# Patient Record
Sex: Female | Born: 1980 | Race: White | Hispanic: No | Marital: Single | Smoking: Former smoker
Health system: Southern US, Community
[De-identification: ages and names within clinical notes are randomized; demographics above are authoritative.]

## PROBLEM LIST (undated history)

## (undated) DIAGNOSIS — A749 Chlamydial infection, unspecified: Secondary | ICD-10-CM

## (undated) DIAGNOSIS — T7840XA Allergy, unspecified, initial encounter: Secondary | ICD-10-CM

## (undated) DIAGNOSIS — Z8619 Personal history of other infectious and parasitic diseases: Secondary | ICD-10-CM

## (undated) DIAGNOSIS — N200 Calculus of kidney: Secondary | ICD-10-CM

## (undated) HISTORY — DX: Allergy, unspecified, initial encounter: T78.40XA

## (undated) HISTORY — DX: Chlamydial infection, unspecified: A74.9

## (undated) HISTORY — PX: DEBRIDEMENT  FOOT: SUR387

## (undated) HISTORY — PX: TUBAL LIGATION: SHX77

## (undated) HISTORY — PX: NO PAST SURGERIES: SHX2092

## (undated) HISTORY — DX: Personal history of other infectious and parasitic diseases: Z86.19

---
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
## 2004-04-15 ENCOUNTER — Inpatient Hospital Stay (HOSPITAL_COMMUNITY): Admission: EM | Admit: 2004-04-15 | Discharge: 2004-04-16 | Payer: Self-pay | Admitting: Emergency Medicine

## 2004-05-14 ENCOUNTER — Encounter: Admission: RE | Admit: 2004-05-14 | Discharge: 2004-05-14 | Payer: Self-pay | Admitting: Neurosurgery

## 2006-11-23 ENCOUNTER — Emergency Department (HOSPITAL_COMMUNITY): Admission: EM | Admit: 2006-11-23 | Discharge: 2006-11-23 | Payer: Self-pay | Admitting: Emergency Medicine
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
## 2010-06-18 ENCOUNTER — Inpatient Hospital Stay (HOSPITAL_COMMUNITY)
Admission: AD | Admit: 2010-06-18 | Discharge: 2010-06-20 | DRG: 373 | Disposition: A | Discharge status: Home / Self Care | Payer: BC Managed Care – PPO | Source: Ambulatory Visit | Attending: Obstetrics and Gynecology | Admitting: Obstetrics and Gynecology

## 2010-06-18 LAB — CBC
HCT: 40.8 % (ref 36.0–46.0)
MCH: 29.9 pg (ref 26.0–34.0)
MCV: 87 fL (ref 78.0–100.0)
RBC: 4.69 MIL/uL (ref 3.87–5.11)

## 2010-06-18 LAB — RPR: RPR Ser Ql: NONREACTIVE

## 2010-06-20 LAB — CBC
MCHC: 32.5 g/dL (ref 30.0–36.0)
Platelets: 139 10*3/uL — ABNORMAL LOW (ref 150–400)
RDW: 14.7 % (ref 11.5–15.5)

## 2013-10-24 ENCOUNTER — Emergency Department (HOSPITAL_COMMUNITY)
Admission: EM | Admit: 2013-10-24 | Discharge: 2013-10-24 | Disposition: A | Discharge status: Home / Self Care | Payer: Medicaid Other | Attending: Emergency Medicine | Admitting: Emergency Medicine

## 2013-10-24 ENCOUNTER — Encounter (HOSPITAL_COMMUNITY): Payer: Self-pay | Admitting: Emergency Medicine

## 2013-10-24 ENCOUNTER — Emergency Department (HOSPITAL_COMMUNITY): Payer: Medicaid Other

## 2013-10-24 DIAGNOSIS — Z3202 Encounter for pregnancy test, result negative: Secondary | ICD-10-CM | POA: Insufficient documentation

## 2013-10-24 DIAGNOSIS — M545 Low back pain, unspecified: Secondary | ICD-10-CM

## 2013-10-24 DIAGNOSIS — Z79899 Other long term (current) drug therapy: Secondary | ICD-10-CM | POA: Insufficient documentation

## 2013-10-24 DIAGNOSIS — K760 Fatty (change of) liver, not elsewhere classified: Secondary | ICD-10-CM | POA: Insufficient documentation

## 2013-10-24 DIAGNOSIS — Z880 Allergy status to penicillin: Secondary | ICD-10-CM | POA: Insufficient documentation

## 2013-10-24 DIAGNOSIS — R109 Unspecified abdominal pain: Secondary | ICD-10-CM | POA: Diagnosis present

## 2013-10-24 DIAGNOSIS — Z87442 Personal history of urinary calculi: Secondary | ICD-10-CM | POA: Diagnosis not present

## 2013-10-24 DIAGNOSIS — Z720 Tobacco use: Secondary | ICD-10-CM | POA: Diagnosis not present

## 2013-10-24 DIAGNOSIS — M79605 Pain in left leg: Secondary | ICD-10-CM

## 2013-10-24 HISTORY — DX: Calculus of kidney: N20.0

## 2013-10-24 LAB — URINALYSIS, ROUTINE W REFLEX MICROSCOPIC
BILIRUBIN URINE: NEGATIVE
GLUCOSE, UA: NEGATIVE mg/dL
Hgb urine dipstick: NEGATIVE
Ketones, ur: NEGATIVE mg/dL
Leukocytes, UA: NEGATIVE
Nitrite: NEGATIVE
Protein, ur: NEGATIVE mg/dL
Specific Gravity, Urine: 1.018 (ref 1.005–1.030)
UROBILINOGEN UA: 0.2 mg/dL (ref 0.0–1.0)
pH: 6 (ref 5.0–8.0)

## 2013-10-24 LAB — POC URINE PREG, ED: Preg Test, Ur: NEGATIVE

## 2013-10-24 MED ORDER — DIAZEPAM 5 MG PO TABS: 5.0000 mg | ORAL_TABLET | Freq: Two times a day (BID) | ORAL | Status: DC

## 2013-10-24 MED ORDER — DIAZEPAM 5 MG PO TABS
5.0000 mg | ORAL_TABLET | Freq: Once | ORAL | Status: AC
  Administered 2013-10-24: 5 mg via ORAL
  Filled 2013-10-24: qty 1

## 2013-10-24 MED ORDER — HYDROCODONE-ACETAMINOPHEN 5-325 MG PO TABS: 1.0000 | ORAL_TABLET | ORAL | Status: DC | PRN

## 2013-10-24 MED ORDER — IBUPROFEN 800 MG PO TABS: 800.0000 mg | ORAL_TABLET | Freq: Three times a day (TID) | ORAL | Status: DC

## 2013-10-24 NOTE — Discharge Instructions (Signed)
Take Vicodin for severe pain only. No driving or operating heavy machinery while taking vicodin. This medication may cause drowsiness. Take Valium as needed as directed for muscle spasm. No driving or operating heavy machinery while taking valium. This medication may cause drowsiness. Take ibuprofen as directed for inflammation. Follow up with your primary care doctor regarding hepatic steatosis.  Radicular Pain Radicular pain in either the arm or leg is usually from a bulging or herniated disk in the spine. A piece of the herniated disk may press against the nerves as the nerves exit the spine. This causes pain which is felt at the tips of the nerves down the arm or leg. Other causes of radicular pain may include:  Fractures.  Heart disease.  Cancer.  An abnormal and usually degenerative state of the nervous system or nerves (neuropathy). Diagnosis may require CT or MRI scanning to determine the primary cause.  Nerves that start at the neck (nerve roots) may cause radicular pain in the outer shoulder and arm. It can spread down to the thumb and fingers. The symptoms vary depending on which nerve root has been affected. In most cases radicular pain improves with conservative treatment. Neck problems may require physical therapy, a neck collar, or cervical traction. Treatment may take many weeks, and surgery may be considered if the symptoms do not improve.  Conservative treatment is also recommended for sciatica. Sciatica causes pain to radiate from the lower back or buttock area down the leg into the foot. Often there is a history of back problems. Most patients with sciatica are better after 2 to 4 weeks of rest and other supportive care. Short term bed rest can reduce the disk pressure considerably. Sitting, however, is not a good position since this increases the pressure on the disk. You should avoid bending, lifting, and all other activities which make the problem worse. Traction can be used in  severe cases. Surgery is usually reserved for patients who do not improve within the first months of treatment. Only take over-the-counter or prescription medicines for pain, discomfort, or fever as directed by your caregiver. Narcotics and muscle relaxants may help by relieving more severe pain and spasm and by providing mild sedation. Cold or massage can give significant relief. Spinal manipulation is not recommended. It can increase the degree of disc protrusion. Epidural steroid injections are often effective treatment for radicular pain. These injections deliver medicine to the spinal nerve in the space between the protective covering of the spinal cord and back bones (vertebrae). Your caregiver can give you more information about steroid injections. These injections are most effective when given within two weeks of the onset of pain.  You should see your caregiver for follow up care as recommended. A program for neck and back injury rehabilitation with stretching and strengthening exercises is an important part of management.  SEEK IMMEDIATE MEDICAL CARE IF:  You develop increased pain, weakness, or numbness in your arm or leg.  You develop difficulty with bladder or bowel control.  You develop abdominal pain. Document Released: 02/05/2004 Document Revised: 03/22/2011 Document Reviewed: 04/22/2008 [facility] Patient Information 2015 [place], [place]. This information is not intended to replace advice given to you by your health care provider. Make sure you discuss any questions you have with your health care provider. Fatty Liver Fatty liver is the accumulation of fat in liver cells. It is also called hepatosteatosis or steatohepatitis. It is normal for your liver to contain some fat. If fat is more than 5  to 10% of your liver's weight, you have fatty liver.  There are often no symptoms (problems) for years while damage is still occurring. People often learn about their fatty liver when they have  medical tests for other reasons. Fat can damage your liver for years or even decades without causing problems. When it becomes severe, it can cause fatigue, weight loss, weakness, and confusion. This makes you more likely to develop more serious liver problems. The liver is the largest organ in the body. It does a lot of work and often gives no warning signs when it is sick until late in a disease. The liver has many important jobs including:  Breaking down foods.  Storing vitamins, iron, and other minerals.  Making proteins.  Making bile for food digestion.  Breaking down many products including medications, alcohol and some poisons. CAUSES  There are a number of different conditions, medications, and poisons that can cause a fatty liver. Eating too many calories causes fat to build up in the liver. Not processing and breaking fats down normally may also cause this. Certain conditions, such as obesity, diabetes, and high triglycerides also cause this. Most fatty liver patients tend to be middle-aged and over weight.  Some causes of fatty liver are:  Alcohol over consumption.  Malnutrition.  Steroid use.  Valproic acid toxicity.  Obesity.  Cushing's syndrome.  Poisons.  Tetracycline in high dosages.  Pregnancy.  Diabetes.  Hyperlipidemia.  Rapid weight loss. Some people develop fatty liver even having none of these conditions. SYMPTOMS  Fatty liver most often causes no problems. This is called asymptomatic.  It can be diagnosed with blood tests and also by a liver biopsy.  It is one of the most common causes of minor elevations of liver enzymes on routine blood tests.  Specialized Imaging of the liver using ultrasound, CT (computed tomography) scan, or MRI (magnetic resonance imaging) can suggest a fatty liver but a biopsy is needed to confirm it.  A biopsy involves taking a small sample of liver tissue. This is done by using a needle. It is then looked at under a  microscope by a specialist. TREATMENT  It is important to treat the cause. Simple fatty liver without a medical reason may not need treatment.  Weight loss, fat restriction, and exercise in overweight patients produces inconsistent results but is worth trying.  Fatty liver due to alcohol toxicity may not improve even with stopping drinking.  Good control of diabetes may reduce fatty liver.  Lower your triglycerides through diet, medication or both.  Eat a balanced, healthy diet.  Increase your physical activity.  Get regular checkups from a liver specialist.  There are no medical or surgical treatments for a fatty liver or NASH, but improving your diet and increasing your exercise may help prevent or reverse some of the damage. PROGNOSIS  Fatty liver may cause no damage or it can lead to an inflammation of the liver. This is, called steatohepatitis. When it is linked to alcohol abuse, it is called alcoholic steatohepatitis. It often is not linked to alcohol. It is then called nonalcoholic steatohepatitis, or NASH. Over time the liver may become scarred and hardened. This condition is called cirrhosis. Cirrhosis is serious and may lead to liver failure or cancer. NASH is one of the leading causes of cirrhosis. About 10-20% of Americans have fatty liver and a smaller 2-5% has NASH. Document Released: 02/12/2005 Document Revised: 03/22/2011 Document Reviewed: 05/09/2013 [facility] Patient Information 2015 [place], [place]. This information  is not intended to replace advice given to you by your health care provider. Make sure you discuss any questions you have with your health care provider. Sciatica Sciatica is pain, weakness, numbness, or tingling along the path of the sciatic nerve. The nerve starts in the lower back and runs down the back of each leg. The nerve controls the muscles in the lower leg and in the back of the knee, while also providing sensation to the back of the thigh, lower leg,  and the sole of your foot. Sciatica is a symptom of another medical condition. For instance, nerve damage or certain conditions, such as a herniated disk or bone spur on the spine, pinch or put pressure on the sciatic nerve. This causes the pain, weakness, or other sensations normally associated with sciatica. Generally, sciatica only affects one side of the body. CAUSES   Herniated or slipped disc.  Degenerative disk disease.  A pain disorder involving the narrow muscle in the buttocks (piriformis syndrome).  Pelvic injury or fracture.  Pregnancy.  Tumor (rare). SYMPTOMS  Symptoms can vary from mild to very severe. The symptoms usually travel from the low back to the buttocks and down the back of the leg. Symptoms can include:  Mild tingling or dull aches in the lower back, leg, or hip.  Numbness in the back of the calf or sole of the foot.  Burning sensations in the lower back, leg, or hip.  Sharp pains in the lower back, leg, or hip.  Leg weakness.  Severe back pain inhibiting movement. These symptoms may get worse with coughing, sneezing, laughing, or prolonged sitting or standing. Also, being overweight may worsen symptoms. DIAGNOSIS  Your caregiver will perform a physical exam to look for common symptoms of sciatica. He or she may ask you to do certain movements or activities that would trigger sciatic nerve pain. Other tests may be performed to find the cause of the sciatica. These may include:  Blood tests.  X-rays.  Imaging tests, such as an MRI or CT scan. TREATMENT  Treatment is directed at the cause of the sciatic pain. Sometimes, treatment is not necessary and the pain and discomfort goes away on its own. If treatment is needed, your caregiver may suggest:  Over-the-counter medicines to relieve pain.  Prescription medicines, such as anti-inflammatory medicine, muscle relaxants, or narcotics.  Applying heat or ice to the painful area.  Steroid injections to  lessen pain, irritation, and inflammation around the nerve.  Reducing activity during periods of pain.  Exercising and stretching to strengthen your abdomen and improve flexibility of your spine. Your caregiver may suggest losing weight if the extra weight makes the back pain worse.  Physical therapy.  Surgery to eliminate what is pressing or pinching the nerve, such as a bone spur or part of a herniated disk. HOME CARE INSTRUCTIONS   Only take over-the-counter or prescription medicines for pain or discomfort as directed by your caregiver.  Apply ice to the affected area for 20 minutes, 3-4 times a day for the first 48-72 hours. Then try heat in the same way.  Exercise, stretch, or perform your usual activities if these do not aggravate your pain.  Attend physical therapy sessions as directed by your caregiver.  Keep all follow-up appointments as directed by your caregiver.  Do not wear high heels or shoes that do not provide proper support.  Check your mattress to see if it is too soft. A firm mattress may lessen your pain and  discomfort. SEEK IMMEDIATE MEDICAL CARE IF:   You lose control of your bowel or bladder (incontinence).  You have increasing weakness in the lower back, pelvis, buttocks, or legs.  You have redness or swelling of your back.  You have a burning sensation when you urinate.  You have pain that gets worse when you lie down or awakens you at night.  Your pain is worse than you have experienced in the past.  Your pain is lasting longer than 4 weeks.  You are suddenly losing weight without reason. MAKE SURE YOU:  Understand these instructions.  Will watch your condition.  Will get help right away if you are not doing well or get worse. Document Released: 12/22/2000 Document Revised: 06/29/2011 Document Reviewed: 05/09/2011 [facility] Patient Information 2015 [place], [place]. This information is not intended to replace advice given to you by your health  care provider. Make sure you discuss any questions you have with your health care provider.

## 2013-10-24 NOTE — ED Notes (Signed)
Pt c/o right flank pain that started yesterday. Pt denies any blood in her urine and took some Flomax bc she has PMH of kidney stones.

## 2013-10-24 NOTE — ED Notes (Signed)
Pharmacy at bedside

## 2013-10-24 NOTE — ED Provider Notes (Signed)
Medical screening examination/treatment/procedure(s) were performed by non-physician practitioner and as supervising physician I was immediately available for consultation/collaboration.   EKG Interpretation None        Kristen N Ward, DO 10/24/13 1422 

## 2013-10-24 NOTE — ED Provider Notes (Signed)
CSN: [id number]     Arrival date & time 10/24/13  1110 History   First MD Initiated Contact with Patient 10/24/13 1141     Chief Complaint  Patient presents with  . Flank Pain     (Consider location/radiation/quality/duration/timing/severity/associated sxs/prior Treatment) HPI Comments: This is a 33 year old female past medical history of kidney stones who presents to the emergency department complaining of right-sided low back pain radiating up towards her mid back, around the right flank, towards her groin and down to her right knee times 2 days. Patient reports yesterday morning she woke up and the pain suddenly came on and was severe, had her bend over on the floor. Pain started to subside throughout the day and has been intermittent since. Currently it is a dull ache rated 6/10, worse if she sits in the same position for long period of time, minimally relieved by Tylenol. States this feels somewhat similar to her prior kidney stone back in 2007. She has not had a kidney stone since. She started taking Flomax as she thought this was a kidney stone and has been urinating more frequently. Denies urgency, dysuria or hematuria. Admits to subjective fevers. Denies nausea or vomiting. Denies any back injury or trauma. Denies numbness or tingling radiating down her extremities. No loss of control of bowels or bladder or saddle anesthesia. States she is staying well hydrated.  Patient is a 33 y.o. female presenting with flank pain. The history is provided by the patient.  Flank Pain Associated symptoms include a fever (subjective).    Past Medical History  Diagnosis Date  . Kidney stone    History reviewed. No pertinent past surgical history. No family history on file. History  Substance Use Topics  . Smoking status: Current Every Day Smoker    Types: Cigarettes  . Smokeless tobacco: Never Used  . Alcohol Use: Yes     Comment: social    OB History   Grav Para Term Preterm Abortions TAB  SAB Ect Mult Living                 Review of Systems  Constitutional: Positive for fever (subjective).  Genitourinary: Positive for frequency (due to Flomax) and flank pain.  Musculoskeletal: Positive for back pain.  All other systems reviewed and are negative.     Allergies  Penicillins  Home Medications   Prior to Admission medications   Medication Sig Start Date End Date Taking? Authorizing Provider  acetaminophen (TYLENOL) 500 MG tablet Take 1,000 mg by mouth every 6 (six) hours as needed for mild pain or moderate pain.   Yes Historical Provider, MD  diazepam (VALIUM) 5 MG tablet Take 1 tablet (5 mg total) by mouth 2 (two) times daily. 10/24/13   Kathrynn Speedobyn M Marijose Curington, PA-C  HYDROcodone-acetaminophen (NORCO/VICODIN) 5-325 MG per tablet Take 1-2 tablets by mouth every 4 (four) hours as needed. 10/24/13   Caylynn Minchew M Miriah Maruyama, PA-C  ibuprofen (ADVIL,MOTRIN) 800 MG tablet Take 1 tablet (800 mg total) by mouth 3 (three) times daily. 10/24/13   Toshio Slusher M Canyon Lohr, PA-C   BP 144/87  Pulse 87  Temp(Src) 98.2 F (36.8 C) (Oral)  Resp 18  SpO2 100%  LMP 10/12/2013 Physical Exam  Nursing note and vitals reviewed. Constitutional: She is oriented to person, place, and time. She appears well-developed and well-nourished. No distress.  HENT:  Head: Normocephalic and atraumatic.  Mouth/Throat: Oropharynx is clear and moist.  Eyes: Conjunctivae are normal.  Neck: Normal range of motion. Neck supple. No  spinous process tenderness and no muscular tenderness present.  Cardiovascular: Normal rate, regular rhythm and normal heart sounds.   Pulmonary/Chest: Effort normal and breath sounds normal. No respiratory distress.  Abdominal: Soft. Bowel sounds are normal. She exhibits no distension. There is no tenderness.  No CVAT.  Musculoskeletal: She exhibits no edema.       Back:  Positive SLR on right.  Neurological: She is alert and oriented to person, place, and time. She has normal strength.  Strength lower  extremities 5/5 and equal bilateral. Sensation intact. Normal gait.  Skin: Skin is warm and dry. No rash noted. She is not diaphoretic.  Psychiatric: She has a normal mood and affect. Her behavior is normal.    ED Course  Procedures (including critical care time) Labs Review Labs Reviewed  URINALYSIS, ROUTINE W REFLEX MICROSCOPIC  POC URINE PREG, ED    Imaging Review Ct Abdomen Pelvis Wo Contrast  10/24/2013   CLINICAL DATA:  33 year old female with acute right flank pain beginning yesterday. Initial encounter.  EXAM: CT ABDOMEN AND PELVIS WITHOUT CONTRAST  TECHNIQUE: Multidetector CT imaging of the abdomen and pelvis was performed following the standard protocol without IV contrast.  COMPARISON:  [facility].  FINDINGS: Minimal dependent atelectasis, otherwise negative lung bases. No pericardial or pleural effusion.  No acute osseous abnormality identified.  No pelvic free fluid. Negative non contrast uterus and adnexa. Decompressed distal colon. Mildly redundant sigmoid. Negative left colon, transverse colon, right colon and appendix. No dilated small bowel. Negative non contrast stomach, duodenum, spleen, pancreas and adrenal glands.  Diffuse hepatic steatosis, new since 2009. Occasional areas of fatty sparing suspected including along the gallbladder fossa, and in the peripheral right lobe (including series 2, image 32). Negative gallbladder. No pericholecystic inflammation. No abdominal free fluid.  Negative non contrast left kidney and ureter.  Decompressed bladder.  Chronic right hemi pelvis phleboliths appear stable. No right hydronephrosis, perinephric stranding, right hydroureter, or periureteral stranding.  IMPRESSION: 1. No obstructive uropathy or urologic calculus identified. Normal appendix. 2. Hepatic steatosis, new since 2009. Query abnormal LFTs, and if so could this be the source of the patient right-sided pain?   Electronically Signed    By: Augusto GambleLee  Hall M.D.   On: 10/24/2013 12:54     EKG Interpretation None      MDM   Final diagnoses:  Low back pain radiating to left lower extremity  Hepatic steatosis   Patient nontoxic appearing and in no apparent distress. Afebrile, vital signs stable. Tender in right lower back, no CVA tenderness. Musculoskeletal there is kidney stone given her history. Patient is concerned of a kidney stone. CT without contrast obtained, no objective uropathy or urologic calculus identified, hepatic steatosis which is new since 2009. Patient is having no abdominal pain and no tenderness in the right upper quadrant. I discussed this finding with patient and advised her to discuss this with her primary care physician. Patient agreeable. Urinalysis normal. Given symptoms exacerbated with straight leg raise on right, most likely radicular or sciatic pain. Will treat with Valium, Vicodin and ibuprofen. No red flags concerning patient's back pain. No signs or symptoms of central cord compression or cauda equina. Ambulates without difficulty. Stable for discharge. F/u with PCP. Return precautions given. Patient states understanding of treatment care plan and is agreeable.  Kathrynn SpeedRobyn M Keisy Strickler, PA-C 10/24/13 1350

## 2014-04-22 ENCOUNTER — Emergency Department (HOSPITAL_COMMUNITY): Payer: Medicaid Other

## 2014-04-22 ENCOUNTER — Emergency Department (HOSPITAL_COMMUNITY)
Admission: EM | Admit: 2014-04-22 | Discharge: 2014-04-22 | Disposition: A | Discharge status: Home / Self Care | Payer: Medicaid Other | Attending: Emergency Medicine | Admitting: Emergency Medicine

## 2014-04-22 ENCOUNTER — Encounter (HOSPITAL_COMMUNITY): Payer: Self-pay | Admitting: Emergency Medicine

## 2014-04-22 DIAGNOSIS — N939 Abnormal uterine and vaginal bleeding, unspecified: Secondary | ICD-10-CM

## 2014-04-22 DIAGNOSIS — Z79899 Other long term (current) drug therapy: Secondary | ICD-10-CM | POA: Diagnosis not present

## 2014-04-22 DIAGNOSIS — Z791 Long term (current) use of non-steroidal anti-inflammatories (NSAID): Secondary | ICD-10-CM | POA: Insufficient documentation

## 2014-04-22 DIAGNOSIS — F17200 Nicotine dependence, unspecified, uncomplicated: Secondary | ICD-10-CM | POA: Diagnosis not present

## 2014-04-22 DIAGNOSIS — M5430 Sciatica, unspecified side: Secondary | ICD-10-CM | POA: Insufficient documentation

## 2014-04-22 DIAGNOSIS — O209 Hemorrhage in early pregnancy, unspecified: Secondary | ICD-10-CM | POA: Diagnosis present

## 2014-04-22 DIAGNOSIS — R1030 Lower abdominal pain, unspecified: Secondary | ICD-10-CM

## 2014-04-22 DIAGNOSIS — Z3490 Encounter for supervision of normal pregnancy, unspecified, unspecified trimester: Secondary | ICD-10-CM

## 2014-04-22 DIAGNOSIS — Z3A01 Less than 8 weeks gestation of pregnancy: Secondary | ICD-10-CM | POA: Insufficient documentation

## 2014-04-22 DIAGNOSIS — Z87442 Personal history of urinary calculi: Secondary | ICD-10-CM | POA: Diagnosis not present

## 2014-04-22 DIAGNOSIS — Z880 Allergy status to penicillin: Secondary | ICD-10-CM | POA: Insufficient documentation

## 2014-04-22 DIAGNOSIS — O9989 Other specified diseases and conditions complicating pregnancy, childbirth and the puerperium: Secondary | ICD-10-CM | POA: Diagnosis not present

## 2014-04-22 DIAGNOSIS — O99331 Smoking (tobacco) complicating pregnancy, first trimester: Secondary | ICD-10-CM | POA: Insufficient documentation

## 2014-04-22 LAB — CBC
HCT: 42.3 % (ref 36.0–46.0)
Hemoglobin: 14.1 g/dL (ref 12.0–15.0)
MCH: 31.7 pg (ref 26.0–34.0)
MCHC: 33.3 g/dL (ref 30.0–36.0)
MCV: 95.1 fL (ref 78.0–100.0)
PLATELETS: 208 10*3/uL (ref 150–400)
RBC: 4.45 MIL/uL (ref 3.87–5.11)
RDW: 13.2 % (ref 11.5–15.5)
WBC: 10.4 10*3/uL (ref 4.0–10.5)

## 2014-04-22 LAB — COMPREHENSIVE METABOLIC PANEL
ALBUMIN: 4.5 g/dL (ref 3.5–5.2)
ALT: 80 U/L — ABNORMAL HIGH (ref 0–35)
AST: 45 U/L — ABNORMAL HIGH (ref 0–37)
Alkaline Phosphatase: 50 U/L (ref 39–117)
Anion gap: 7 (ref 5–15)
BUN: 10 mg/dL (ref 6–23)
CO2: 25 mmol/L (ref 19–32)
CREATININE: 0.54 mg/dL (ref 0.50–1.10)
Calcium: 8.9 mg/dL (ref 8.4–10.5)
Chloride: 104 mmol/L (ref 96–112)
GFR calc non Af Amer: >90 mL/min (ref >90)
Glucose, Bld: 124 mg/dL — ABNORMAL HIGH (ref 70–99)
POTASSIUM: 3.9 mmol/L (ref 3.5–5.1)
Sodium: 136 mmol/L (ref 135–145)
TOTAL PROTEIN: 7.7 g/dL (ref 6.0–8.3)
Total Bilirubin: 0.6 mg/dL (ref 0.3–1.2)

## 2014-04-22 LAB — URINALYSIS, ROUTINE W REFLEX MICROSCOPIC
Glucose, UA: NEGATIVE mg/dL
Ketones, ur: NEGATIVE mg/dL
NITRITE: NEGATIVE
PH: 5 (ref 5.0–8.0)
Protein, ur: NEGATIVE mg/dL
SPECIFIC GRAVITY, URINE: 1.034 — AB (ref 1.005–1.030)
Urobilinogen, UA: 0.2 mg/dL (ref 0.0–1.0)

## 2014-04-22 LAB — URINE MICROSCOPIC-ADD ON

## 2014-04-22 LAB — WET PREP, GENITAL
TRICH WET PREP: NONE SEEN
Yeast Wet Prep HPF POC: NONE SEEN

## 2014-04-22 LAB — HCG, QUANTITATIVE, PREGNANCY: HCG, BETA CHAIN, QUANT, S: 161 m[IU]/mL — AB (ref <5)

## 2014-04-22 NOTE — ED Notes (Signed)
Awake. Verbally responsive. Resp even and unlabored. No audible adventitious breath sounds noted. ABC's intact. Abd soft/nondistended but tender to palpate. BS (+) and active x4 quadrants. No N/V/D reported. 

## 2014-04-22 NOTE — Discharge Instructions (Signed)
You have been evaluated for your pelvic pain and vaginal bleeding.  You are pregnant, however no evidence of fetus on Ultra Sound, therefore you will need to follow up with [facility] at the end of this week or next week for a recheck of your Quantitative Hcg and repeat UltraSound for further evaluation.  Return if your condition worsen.    Threatened Miscarriage A threatened miscarriage occurs when you have vaginal bleeding during your first 20 weeks of pregnancy but the pregnancy has not ended. If you have vaginal bleeding during this time, your health care provider will do tests to make sure you are still pregnant. If the tests show you are still pregnant and the developing baby (fetus) inside your womb (uterus) is still growing, your condition is considered a threatened miscarriage. A threatened miscarriage does not mean your pregnancy will end, but it does increase the risk of losing your pregnancy (complete miscarriage). CAUSES  The cause of a threatened miscarriage is usually not known. If you go on to have a complete miscarriage, the most common cause is an abnormal number of chromosomes in the developing baby. Chromosomes are the structures inside cells that hold all your genetic material. Some causes of vaginal bleeding that do not result in miscarriage include:  Having sex.  Having an infection.  Normal hormone changes of pregnancy.  Bleeding that occurs when an egg implants in your uterus. RISK FACTORS Risk factors for bleeding in early pregnancy include:  Obesity.  Smoking.  Drinking excessive amounts of alcohol or caffeine.  Recreational drug use. SIGNS AND SYMPTOMS  Light vaginal bleeding.  Mild abdominal pain or cramps. DIAGNOSIS  If you have bleeding with or without abdominal pain before 20 weeks of pregnancy, your health care provider will do tests to check whether you are still pregnant. One important test involves using sound waves and a computer (ultrasound)  to create images of the inside of your uterus. Other tests include an internal exam of your vagina and uterus (pelvic exam) and measurement of your baby's heart rate.  You may be diagnosed with a threatened miscarriage if:  Ultrasound testing shows you are still pregnant.  Your baby's heart rate is strong.  A pelvic exam shows that the opening between your uterus and your vagina (cervix) is closed.  Your heart rate and blood pressure are stable.  Blood tests confirm you are still pregnant. TREATMENT  No treatments have been shown to prevent a threatened miscarriage from going on to a complete miscarriage. However, the right home care is important.  HOME CARE INSTRUCTIONS   Make sure you keep all your appointments for prenatal care. This is very important.  Get plenty of rest.  Do not have sex or use tampons if you have vaginal bleeding.  Do not douche.  Do not smoke or use recreational drugs.  Do not drink alcohol.  Avoid caffeine. SEEK MEDICAL CARE IF:  You have light vaginal bleeding or spotting while pregnant.  You have abdominal pain or cramping.  You have a fever. SEEK IMMEDIATE MEDICAL CARE IF:  You have heavy vaginal bleeding.  You have blood clots coming from your vagina.  You have severe low back pain or abdominal cramps.  You have fever, chills, and severe abdominal pain. MAKE SURE YOU:  Understand these instructions.  Will watch your condition.  Will get help right away if you are not doing well or get worse. Document Released: 12/28/2004 Document Revised: 01/02/2013 Document Reviewed: 10/24/2012 Exit[employer] Patient Information 2015  [employer]. This information is not intended to replace advice given to you by your health care provider. Make sure you discuss any questions you have with your health care provider.

## 2014-04-22 NOTE — ED Provider Notes (Signed)
CSN: [id number]     Arrival date & time 04/22/14  1452 History   First MD Initiated Contact with Patient 04/22/14 1706     Chief Complaint  Patient presents with  . Abdominal Cramping  . Vaginal Bleeding     (Consider location/radiation/quality/duration/timing/severity/associated sxs/prior Treatment) HPI   34 year old G2P1 female presents with low abdominal pain.  Report R side lower abd pain this morning accompany with vaginal bleeding.  Pain is crampy across lower abdomen, persistent throughout the day.  She also has hx of sciatica and report having R side leg pain while sitting in class, which has since resolved.  She report having 3 positive pregnancy test at home.  Pt sts she think she's [redacted] weeks pregnant. LMP March 5th.  She is concerning of miscarriage.  Denies any recent trauma.  No fever, chills, n/v/d, cp, sob, productive cough, dysuria, or vaginal discharge.  No specific treatment tried.    Past Medical History  Diagnosis Date  . Kidney stone    History reviewed. No pertinent past surgical history. No family history on file. History  Substance Use Topics  . Smoking status: Current Every Day Smoker    Types: Cigarettes  . Smokeless tobacco: Never Used  . Alcohol Use: Yes     Comment: social    OB History    Gravida Para Term Preterm AB TAB SAB Ectopic Multiple Living   1              Review of Systems  All other systems reviewed and are negative.     Allergies  Penicillins  Home Medications   Prior to Admission medications   Medication Sig Start Date End Date Taking? Authorizing Provider  Prenatal Vit-Fe Fumarate-FA (PRENATAL VITAMIN PO) Take 1 tablet by mouth daily.   Yes Historical Provider, MD  acetaminophen (TYLENOL) 500 MG tablet Take 1,000 mg by mouth every 6 (six) hours as needed for mild pain or moderate pain.    Historical Provider, MD  diazepam (VALIUM) 5 MG tablet Take 1 tablet (5 mg total) by mouth 2 (two) times daily. Patient not taking:  Reported on 04/22/2014 10/24/13   Kathrynn Speed, PA-C  HYDROcodone-acetaminophen (NORCO/VICODIN) 5-325 MG per tablet Take 1-2 tablets by mouth every 4 (four) hours as needed. Patient not taking: Reported on 04/22/2014 10/24/13   Kathrynn Speed, PA-C  ibuprofen (ADVIL,MOTRIN) 800 MG tablet Take 1 tablet (800 mg total) by mouth 3 (three) times daily. Patient not taking: Reported on 04/22/2014 10/24/13   Nada Boozer Hess, PA-C   BP 122/68 mmHg  Pulse 72  Temp(Src) 99.1 F (37.3 C) (Oral)  Resp 16  SpO2 100%  LMP 10/12/2013 Physical Exam  Constitutional: She appears well-developed and well-nourished. No distress.  HENT:  Head: Atraumatic.  Eyes: Conjunctivae are normal.  Neck: Neck supple.  Cardiovascular: Normal rate and regular rhythm.   Pulmonary/Chest: Effort normal and breath sounds normal.  Abdominal: Soft. Bowel sounds are normal. She exhibits no distension. There is tenderness (Suprapubic tenderness without guarding or rebound tenderness. Negative Murphy sign, no pain at McBurney's point.).  Genitourinary:  Chaperone present during exam. Normal external genitalia, no pain with speculum insertion, mild functional white vaginal discharge in vaginal vault. Cervical os is visualized, trace of blood noted at the os but os is closed. On bimanual examination, no adnexal tenderness on cervical motion tenderness, no mass appreciated.  Neurological: She is alert.  Skin: No rash noted.  Psychiatric: She has a normal mood and  affect.  Nursing note and vitals reviewed.   ED Course  Procedures (including critical care time)  7:17 PM Patient is currently pregnant, estimated to be 4-5 weeks since last menstrual period. She is here with low abdominal cramping with vaginal bleeding concerning for threatened miscarriage. Her hCG Quant is 161, much lower than the expected quant. She will need a transvaginal ultrasound for further evaluation.   9:52 PM Transvaginal ultrasound shows no intrauterine or  adnexal pregnancy identified. It is recommended that patient receive serial quantitative beta hCG and follow-up ultrasound to document progression and location of pregnancy. Ectopic pregnancy has not been excluded. Patient was made aware of her findings and agrees to follow-up as appropriate. Return precautions discussed. At this time patient stable for discharge.  Labs Review Labs Reviewed  HCG, QUANTITATIVE, PREGNANCY - Abnormal; Notable for the following:    hCG, Beta Chain, Quant, S 161 (*)    All other components within normal limits  COMPREHENSIVE METABOLIC PANEL - Abnormal; Notable for the following:    Glucose, Bld 124 (*)    AST 45 (*)    ALT 80 (*)    All other components within normal limits  URINALYSIS, ROUTINE W REFLEX MICROSCOPIC - Abnormal; Notable for the following:    Color, Urine AMBER (*)    APPearance CLOUDY (*)    Specific Gravity, Urine 1.034 (*)    Hgb urine dipstick LARGE (*)    Bilirubin Urine SMALL (*)    Leukocytes, UA SMALL (*)    All other components within normal limits  URINE MICROSCOPIC-ADD ON - Abnormal; Notable for the following:    Squamous Epithelial / LPF FEW (*)    Bacteria, UA MANY (*)    All other components within normal limits  CBC  ABO/RH    Imaging Review US Ob Comp Less 14 Wks  04/22/2014   CLINICAL DATA:  Spotting and cramping. Beta HCG is 161. By LMP the patient is 5 weeks 4 days. EDC by LMP is 12/19/2014  EXAM: OBSTETRIC <14 WK [place] AND TRANSVAGINAL OB US  TECHNIQUE: Both transabdominal and transvaginal ultrasound examinations were performed for complete evaluation of the gestation as well as the maternal uterus, adnexal regions, and pelvic cul-de-sac. Transvaginal technique was performed to assess early pregnancy.  COMPARISON:  CT of the abdomen and pelvis 10/24/2013  FINDINGS: Intrauterine gestational sac: Not seen  Yolk sac:  Not seen  Embryo:  Not seen  Cardiac Activity: Not seen  Maternal uterus/adnexae: No subchorionic hemorrhage.  Right ovary is 3.2 x 2.0 x 2.0 cm. Left ovary is 3.9 x 2.1 x 3.0 cm. A hypoechoic probable corpus luteum cyst is 1.4 x 1.3 x 1.2 cm. Endometrium is thickened and homogeneous, 1.1 cm. No free pelvic fluid.  IMPRESSION: 1. No intrauterine or adnexal pregnancy identified. 2. Serial quantitative beta HCG values and follow-up ultrasound are recommended as appropriate to document progression of and location of pregnancy. Ectopic pregnancy has not been excluded.   Electronically Signed   By: Norva Pavlov M.D.   On: 04/22/2014 21:03   US Ob Transvaginal  04/22/2014   CLINICAL DATA:  Spotting and cramping. Beta HCG is 161. By LMP the patient is 5 weeks 4 days. EDC by LMP is 12/19/2014  EXAM: OBSTETRIC <14 WK [place] AND TRANSVAGINAL OB US  TECHNIQUE: Both transabdominal and transvaginal ultrasound examinations were performed for complete evaluation of the gestation as well as the maternal uterus, adnexal regions, and pelvic cul-de-sac. Transvaginal technique was performed to assess early  pregnancy.  COMPARISON:  CT of the abdomen and pelvis 10/24/2013  FINDINGS: Intrauterine gestational sac: Not seen  Yolk sac:  Not seen  Embryo:  Not seen  Cardiac Activity: Not seen  Maternal uterus/adnexae: No subchorionic hemorrhage. Right ovary is 3.2 x 2.0 x 2.0 cm. Left ovary is 3.9 x 2.1 x 3.0 cm. A hypoechoic probable corpus luteum cyst is 1.4 x 1.3 x 1.2 cm. Endometrium is thickened and homogeneous, 1.1 cm. No free pelvic fluid.  IMPRESSION: 1. No intrauterine or adnexal pregnancy identified. 2. Serial quantitative beta HCG values and follow-up ultrasound are recommended as appropriate to document progression of and location of pregnancy. Ectopic pregnancy has not been excluded.   Electronically Signed   By: Norva PavlovElizabeth  Brown M.D.   On: 04/22/2014 21:03     EKG Interpretation None      MDM   Final diagnoses:  Pregnancy  Vaginal bleeding  Lower abdominal pain    BP 119/66 mmHg  Pulse 76  Temp(Src) 99.1 F (37.3 C)  (Oral)  Resp 16  SpO2 97%  LMP 10/12/2013  I have reviewed nursing notes and vital signs. I personally reviewed the imaging tests through PACS system  I reviewed available ER/hospitalization records thought the EMR     Fayrene HelperBowie Daylen Hack, PA-C 04/22/14 2154  Gilda Creasehristopher J Pollina, MD 04/22/14 2156

## 2014-04-22 NOTE — ED Notes (Signed)
Awake. Verbally responsive. A/O x4. Resp even and unlabored. No audible adventitious breath sounds noted. ABC's intact.  

## 2014-04-22 NOTE — ED Notes (Signed)
PA at bedside.

## 2014-04-22 NOTE — ED Notes (Signed)
Pt sts she is about [redacted] weeks pregnant and started having R side abdominal cramping this morning with light pink bleeding. Denies passing any clots or dark red blood. Pt is concerned she is having a miscarriage. Pt has no hx with difficult pregnancies. Pt has one son.

## 2014-04-22 NOTE — ED Notes (Addendum)
Awake. Verbally responsive. Resp even and unlabored. No audible adventitious breath sounds noted. ABC's intact. Abd soft/nondistended but tender to palpate. BS (+) and active x4 quadrants. No N/V/D reported. Denies vaginal bleeding at this time.

## 2014-04-22 NOTE — ED Notes (Signed)
Ultrasound at bedside

## 2014-04-23 LAB — GC/CHLAMYDIA PROBE AMP (~~LOC~~) NOT AT ARMC
Chlamydia: NEGATIVE
Neisseria Gonorrhea: NEGATIVE

## 2014-04-23 LAB — ABO/RH: ABO/RH(D): O POS

## 2014-04-23 LAB — RPR: RPR Ser Ql: NONREACTIVE

## 2014-04-23 LAB — HIV ANTIBODY (ROUTINE TESTING W REFLEX): HIV SCREEN 4TH GENERATION: NONREACTIVE

## 2014-10-29 LAB — OB RESULTS CONSOLE ABO/RH: RH TYPE: POSITIVE

## 2014-10-29 LAB — OB RESULTS CONSOLE HIV ANTIBODY (ROUTINE TESTING): HIV: NONREACTIVE

## 2014-10-29 LAB — OB RESULTS CONSOLE GC/CHLAMYDIA
CHLAMYDIA, DNA PROBE: NEGATIVE
Gonorrhea: NEGATIVE

## 2014-10-29 LAB — OB RESULTS CONSOLE ANTIBODY SCREEN: Antibody Screen: NEGATIVE

## 2014-10-29 LAB — OB RESULTS CONSOLE RUBELLA ANTIBODY, IGM: Rubella: IMMUNE

## 2014-10-29 LAB — OB RESULTS CONSOLE RPR: RPR: NONREACTIVE

## 2014-10-29 LAB — OB RESULTS CONSOLE HEPATITIS B SURFACE ANTIGEN: Hepatitis B Surface Ag: NEGATIVE

## 2015-01-12 NOTE — L&D Delivery Note (Signed)
Delivery Note Pt progressed to complete and pushed well and at 4:43 PM a healthy female was delivered via  (Presentation: OA ).  APGAR: 8,9 ; weight pending .   Placenta status delivered spontaneously .  Cord:  with the following complications: nuchal x 1  Anesthesia: epidural  Episiotomy: none  Lacerations:  First degree repaired for hemostasis Suture Repair: 3.0 vicryl Est. Blood Loss (mL):  225ml  Mom to postpartum.  Baby to Couplet care / Skin to Skin.  Oliver PilaICHARDSON,Darothy Courtright W 05/13/2015, 5:05 PM

## 2015-04-15 LAB — OB RESULTS CONSOLE GBS: GBS: NEGATIVE

## 2015-05-07 ENCOUNTER — Telehealth (HOSPITAL_COMMUNITY): Payer: Self-pay | Admitting: *Deleted

## 2015-05-07 ENCOUNTER — Encounter (HOSPITAL_COMMUNITY): Payer: Self-pay | Admitting: *Deleted

## 2015-05-07 NOTE — Telephone Encounter (Signed)
Preadmission screen  

## 2015-05-12 NOTE — H&P (Signed)
Jamie Cantrell is a 35 y.o. female G3P1011 at 8840 3/7 weeks (EDD 05/10/15 by LMP c/w 12 week US) presenting for IOL post due date with favorable cervix.  Prenatal care uncomplicated.  Maternal Medical History:  Contractions: Frequency: irregular.   Perceived severity is mild.    Fetal activity: Perceived fetal activity is normal.    Prenatal Complications - Diabetes: none.    OB History    Gravida Para Term Preterm AB TAB SAB Ectopic Multiple Living   3 1 1  1  1   1     2012 NSVD 6#5oz SAB x 1  Past Medical History  Diagnosis Date  . Kidney stone   . Hx of chlamydia infection    No past surgical history on file. Family History: family history is not on file. Social History:  reports that she has been smoking Cigarettes.  She has never used smokeless tobacco. She reports that she drinks alcohol. Her drug history is not on file.   Prenatal Transfer Tool  Maternal Diabetes: No Genetic Screening: Normal Maternal Ultrasounds/Referrals: Normal Fetal Ultrasounds or other Referrals:  None Maternal Substance Abuse:  No Significant Maternal Medications:  None Significant Maternal Lab Results:  None Other Comments:  None  Review of Systems  Gastrointestinal: Negative for abdominal pain.      Last menstrual period 08/03/2014. Maternal Exam:  Uterine Assessment: Contraction strength is mild.  Contraction frequency is irregular.   Abdomen: Patient reports no abdominal tenderness. Fetal presentation: vertex  Introitus: Normal vulva. Normal vagina.    Physical Exam  Constitutional: She appears well-developed.  Cardiovascular: Normal rate and regular rhythm.   Respiratory: Effort normal and breath sounds normal.  GI: Soft.  Genitourinary: Vagina normal.  Neurological: She is alert.  Psychiatric: She has a normal mood and affect. Her behavior is normal.    Prenatal labs: ABO, Rh: O/Positive/-- (10/18 0000) Antibody: Negative (10/18 0000) Rubella: Immune (10/18 0000) RPR:  Nonreactive (10/18 0000)  HBsAg: Negative (10/18 0000)  HIV: Non-reactive (10/18 0000)  GBS: Negative (04/04 0000)  Tetra screen WNL One hour GCT 101  Assessment/Plan: Pt for IOL, plan AROM and pitocin   Jaze Rodino W 05/12/2015, 10:48 AM

## 2015-05-13 ENCOUNTER — Inpatient Hospital Stay (HOSPITAL_COMMUNITY): Payer: Medicaid Other | Admitting: Anesthesiology

## 2015-05-13 ENCOUNTER — Encounter (HOSPITAL_COMMUNITY): Payer: Self-pay

## 2015-05-13 ENCOUNTER — Inpatient Hospital Stay (HOSPITAL_COMMUNITY)
Admission: RE | Admit: 2015-05-13 | Discharge: 2015-05-14 | DRG: 775 | Disposition: A | Discharge status: Home / Self Care | Payer: Medicaid Other | Source: Ambulatory Visit | Attending: Obstetrics and Gynecology | Admitting: Obstetrics and Gynecology

## 2015-05-13 DIAGNOSIS — O99334 Smoking (tobacco) complicating childbirth: Secondary | ICD-10-CM | POA: Diagnosis present

## 2015-05-13 DIAGNOSIS — Z3A40 40 weeks gestation of pregnancy: Secondary | ICD-10-CM | POA: Diagnosis not present

## 2015-05-13 DIAGNOSIS — Z87442 Personal history of urinary calculi: Secondary | ICD-10-CM

## 2015-05-13 DIAGNOSIS — F17210 Nicotine dependence, cigarettes, uncomplicated: Secondary | ICD-10-CM | POA: Diagnosis present

## 2015-05-13 DIAGNOSIS — O480 Post-term pregnancy: Principal | ICD-10-CM | POA: Diagnosis present

## 2015-05-13 LAB — CBC
HCT: 38.9 % (ref 36.0–46.0)
HEMOGLOBIN: 13.1 g/dL (ref 12.0–15.0)
MCH: 28.4 pg (ref 26.0–34.0)
MCHC: 33.7 g/dL (ref 30.0–36.0)
MCV: 84.2 fL (ref 78.0–100.0)
PLATELETS: 156 10*3/uL (ref 150–400)
RBC: 4.62 MIL/uL (ref 3.87–5.11)
RDW: 15.6 % — AB (ref 11.5–15.5)
WBC: 11.5 10*3/uL — ABNORMAL HIGH (ref 4.0–10.5)

## 2015-05-13 LAB — TYPE AND SCREEN
ABO/RH(D): O POS
ANTIBODY SCREEN: NEGATIVE

## 2015-05-13 LAB — RPR: RPR Ser Ql: NONREACTIVE

## 2015-05-13 MED ORDER — OXYTOCIN 10 UNIT/ML IJ SOLN
1.0000 m[IU]/min | INTRAVENOUS | Status: DC
  Administered 2015-05-13: 2 m[IU]/min via INTRAVENOUS
  Administered 2015-05-13: 8 m[IU]/min via INTRAVENOUS
  Administered 2015-05-13: 4 m[IU]/min via INTRAVENOUS
  Administered 2015-05-13: 12 m[IU]/min via INTRAVENOUS
  Filled 2015-05-13: qty 4

## 2015-05-13 MED ORDER — LACTATED RINGERS IV SOLN
500.0000 mL | INTRAVENOUS | Status: DC | PRN
  Administered 2015-05-13: 500 mL via INTRAVENOUS

## 2015-05-13 MED ORDER — DIPHENHYDRAMINE HCL 50 MG/ML IJ SOLN: 12.5000 mg | INTRAMUSCULAR | Status: DC | PRN

## 2015-05-13 MED ORDER — OXYTOCIN 10 UNIT/ML IJ SOLN: 2.5000 [IU]/h | INTRAVENOUS | Status: DC

## 2015-05-13 MED ORDER — ONDANSETRON HCL 4 MG/2ML IJ SOLN: 4.0000 mg | INTRAMUSCULAR | Status: DC | PRN

## 2015-05-13 MED ORDER — TETANUS-DIPHTH-ACELL PERTUSSIS 5-2.5-18.5 LF-MCG/0.5 IM SUSP: 0.5000 mL | Freq: Once | INTRAMUSCULAR | Status: DC

## 2015-05-13 MED ORDER — IBUPROFEN 600 MG PO TABS
600.0000 mg | ORAL_TABLET | Freq: Four times a day (QID) | ORAL | Status: DC
  Administered 2015-05-13 – 2015-05-14 (×4): 600 mg via ORAL
  Filled 2015-05-13 (×4): qty 1

## 2015-05-13 MED ORDER — EPHEDRINE 5 MG/ML INJ
10.0000 mg | INTRAVENOUS | Status: DC | PRN
  Filled 2015-05-13: qty 2
  Filled 2015-05-13: qty 4

## 2015-05-13 MED ORDER — ACETAMINOPHEN 325 MG PO TABS: 650.0000 mg | ORAL_TABLET | ORAL | Status: DC | PRN

## 2015-05-13 MED ORDER — ONDANSETRON HCL 4 MG PO TABS: 4.0000 mg | ORAL_TABLET | ORAL | Status: DC | PRN

## 2015-05-13 MED ORDER — OXYCODONE-ACETAMINOPHEN 5-325 MG PO TABS
2.0000 | ORAL_TABLET | ORAL | Status: DC | PRN
Start: 2015-05-13 — End: 2015-05-13

## 2015-05-13 MED ORDER — COCONUT OIL OIL: 1.0000 "application " | TOPICAL_OIL | Status: DC | PRN

## 2015-05-13 MED ORDER — OXYCODONE-ACETAMINOPHEN 5-325 MG PO TABS: 1.0000 | ORAL_TABLET | ORAL | Status: DC | PRN

## 2015-05-13 MED ORDER — WITCH HAZEL-GLYCERIN EX PADS: 1.0000 "application " | MEDICATED_PAD | CUTANEOUS | Status: DC | PRN

## 2015-05-13 MED ORDER — OXYCODONE HCL 5 MG PO TABS: 5.0000 mg | ORAL_TABLET | ORAL | Status: DC | PRN

## 2015-05-13 MED ORDER — OXYTOCIN BOLUS FROM INFUSION: 500.0000 mL | INTRAVENOUS | Status: DC

## 2015-05-13 MED ORDER — LIDOCAINE HCL (PF) 1 % IJ SOLN
INTRAMUSCULAR | Status: DC | PRN
  Administered 2015-05-13: 5 mL
  Administered 2015-05-13: 2 mL via EPIDURAL
  Administered 2015-05-13: 3 mL

## 2015-05-13 MED ORDER — ONDANSETRON HCL 4 MG/2ML IJ SOLN: 4.0000 mg | Freq: Four times a day (QID) | INTRAMUSCULAR | Status: DC | PRN

## 2015-05-13 MED ORDER — BENZOCAINE-MENTHOL 20-0.5 % EX AERO
1.0000 "application " | INHALATION_SPRAY | CUTANEOUS | Status: DC | PRN
  Administered 2015-05-13: 1 via TOPICAL
  Filled 2015-05-13: qty 56

## 2015-05-13 MED ORDER — CITRIC ACID-SODIUM CITRATE 334-500 MG/5ML PO SOLN: 30.0000 mL | ORAL | Status: DC | PRN

## 2015-05-13 MED ORDER — LIDOCAINE HCL (PF) 1 % IJ SOLN
30.0000 mL | INTRAMUSCULAR | Status: DC | PRN
  Filled 2015-05-13: qty 30

## 2015-05-13 MED ORDER — OXYCODONE HCL 5 MG PO TABS: 10.0000 mg | ORAL_TABLET | ORAL | Status: DC | PRN

## 2015-05-13 MED ORDER — ZOLPIDEM TARTRATE 5 MG PO TABS: 5.0000 mg | ORAL_TABLET | Freq: Every evening | ORAL | Status: DC | PRN

## 2015-05-13 MED ORDER — FENTANYL 2.5 MCG/ML BUPIVACAINE 1/10 % EPIDURAL INFUSION (WH - ANES)
14.0000 mL/h | INTRAMUSCULAR | Status: DC | PRN
  Administered 2015-05-13 (×2): 14 mL/h via EPIDURAL
  Filled 2015-05-13 (×2): qty 125

## 2015-05-13 MED ORDER — SIMETHICONE 80 MG PO CHEW: 80.0000 mg | CHEWABLE_TABLET | ORAL | Status: DC | PRN

## 2015-05-13 MED ORDER — TERBUTALINE SULFATE 1 MG/ML IJ SOLN
0.2500 mg | Freq: Once | INTRAMUSCULAR | Status: DC | PRN
  Filled 2015-05-13: qty 1

## 2015-05-13 MED ORDER — EPHEDRINE 5 MG/ML INJ
10.0000 mg | INTRAVENOUS | Status: DC | PRN
  Filled 2015-05-13: qty 2

## 2015-05-13 MED ORDER — PHENYLEPHRINE 40 MCG/ML (10ML) SYRINGE FOR IV PUSH (FOR BLOOD PRESSURE SUPPORT)
80.0000 ug | PREFILLED_SYRINGE | INTRAVENOUS | Status: DC | PRN
  Filled 2015-05-13: qty 10
  Filled 2015-05-13: qty 5

## 2015-05-13 MED ORDER — PHENYLEPHRINE 40 MCG/ML (10ML) SYRINGE FOR IV PUSH (FOR BLOOD PRESSURE SUPPORT)
80.0000 ug | PREFILLED_SYRINGE | INTRAVENOUS | Status: DC | PRN
  Filled 2015-05-13: qty 5

## 2015-05-13 MED ORDER — DIPHENHYDRAMINE HCL 25 MG PO CAPS: 25.0000 mg | ORAL_CAPSULE | Freq: Four times a day (QID) | ORAL | Status: DC | PRN

## 2015-05-13 MED ORDER — ACETAMINOPHEN 325 MG PO TABS
650.0000 mg | ORAL_TABLET | ORAL | Status: DC | PRN
  Administered 2015-05-13: 650 mg via ORAL
  Filled 2015-05-13: qty 2

## 2015-05-13 MED ORDER — PHENYLEPHRINE 40 MCG/ML (10ML) SYRINGE FOR IV PUSH (FOR BLOOD PRESSURE SUPPORT)
80.0000 ug | PREFILLED_SYRINGE | INTRAVENOUS | Status: DC | PRN
Start: 2015-05-13 — End: 2015-05-13
  Filled 2015-05-13: qty 5

## 2015-05-13 MED ORDER — SENNOSIDES-DOCUSATE SODIUM 8.6-50 MG PO TABS
2.0000 | ORAL_TABLET | ORAL | Status: DC
Start: 2015-05-14 — End: 2015-05-14
  Administered 2015-05-14: 2 via ORAL
  Filled 2015-05-13: qty 2

## 2015-05-13 MED ORDER — DIBUCAINE 1 % RE OINT: 1.0000 "application " | TOPICAL_OINTMENT | RECTAL | Status: DC | PRN

## 2015-05-13 MED ORDER — LACTATED RINGERS IV SOLN: 500.0000 mL | Freq: Once | INTRAVENOUS | Status: DC

## 2015-05-13 MED ORDER — LACTATED RINGERS IV SOLN
INTRAVENOUS | Status: DC
  Administered 2015-05-13: 1000 mL via INTRAVENOUS

## 2015-05-13 MED ORDER — PRENATAL MULTIVITAMIN CH
1.0000 | ORAL_TABLET | Freq: Every day | ORAL | Status: DC
  Administered 2015-05-14: 1 via ORAL
  Filled 2015-05-13: qty 1

## 2015-05-13 NOTE — Anesthesia Preprocedure Evaluation (Signed)
Anesthesia Evaluation  Patient identified by MRN, date of birth, ID band Patient awake    Reviewed: Allergy & Precautions, NPO status , Patient's Chart, lab work & pertinent test results  Airway Mallampati: II  TM Distance: >3 FB     Dental   Pulmonary Current Smoker,    Pulmonary exam normal        Cardiovascular negative cardio ROS Normal cardiovascular exam     Neuro/Psych negative neurological ROS     GI/Hepatic negative GI ROS, Neg liver ROS,   Endo/Other  negative endocrine ROS  Renal/GU negative Renal ROS     Musculoskeletal   Abdominal   Peds  Hematology negative hematology ROS (+)   Anesthesia Other Findings   Reproductive/Obstetrics (+) Pregnancy                             Lab Results  Component Value Date   WBC 11.5* 05/13/2015   HGB 13.1 05/13/2015   HCT 38.9 05/13/2015   MCV 84.2 05/13/2015   PLT 156 05/13/2015   Lab Results  Component Value Date   CREATININE 0.54 04/22/2014   BUN 10 04/22/2014   NA 136 04/22/2014   K 3.9 04/22/2014   CL 104 04/22/2014   CO2 25 04/22/2014    Anesthesia Physical Anesthesia Plan  ASA: II  Anesthesia Plan: Epidural   Post-op Pain Management:    Induction:   Airway Management Planned: Natural Airway  Additional Equipment:   Intra-op Plan:   Post-operative Plan:   Informed Consent: I have reviewed the patients History and Physical, chart, labs and discussed the procedure including the risks, benefits and alternatives for the proposed anesthesia with the patient or authorized representative who has indicated his/her understanding and acceptance.     Plan Discussed with:   Anesthesia Plan Comments:         Anesthesia Quick Evaluation

## 2015-05-13 NOTE — Progress Notes (Signed)
Patient ID: Jamie Cantrell, female   DOB: [birth year], 35 y.o.   MRN: [id number] Pt comfortable with epidural afeb vss FHR category 1  Cervix 80/4/-1  IUPC placed to adjust pitocin Follow progress

## 2015-05-13 NOTE — Anesthesia Pain Management Evaluation Note (Signed)
  CRNA Pain Management Visit Note  Patient: Jamie Cantrell, 35 y.o., female  "Hello I am a member of the anesthesia team at [facility]. We have an anesthesia team available at all times to provide care throughout the hospital, including epidural management and anesthesia for C-section. I don't know your plan for the delivery whether it a natural birth, water birth, IV sedation, nitrous supplementation, doula or epidural, but we want to meet your pain goals."   1.Was your pain managed to your expectations on prior hospitalizations?   2.What is your expectation for pain management during this hospitalization?      3.How can we help you reach that goal?   Record the patient's initial score and the patient's pain goal.   Pain: 0  Pain Goal: 6  [facility] wants you to be able to say your pain was always managed very well.  Jamie Cantrell,Jamie Cantrell 05/13/2015

## 2015-05-13 NOTE — Progress Notes (Signed)
Patient ID: Jamie Cantrell, female   DOB: 23-Nov-1980, 35 y.o.   MRN: [id number] Pt feeling mild pressure FHR category 1  C/9/0  Will recheck in 30 minutes

## 2015-05-13 NOTE — Progress Notes (Addendum)
Patient ID: Jamie Cantrell, female   DOB: 1980-08-05, 35 y.o.   MRN: [id number] Pt on pitocin, feeling only mild contractions  afeb vss FHR category 1 Cervix 2/70/-2 AROM clear  Continue to increase pitocin Epidural prn

## 2015-05-13 NOTE — Anesthesia Procedure Notes (Signed)
Epidural Patient location during procedure: OB  Staffing Anesthesiologist: Marcene DuosFITZGERALD, Thorsten Climer Performed by: anesthesiologist   Preanesthetic Checklist Completed: patient identified, site marked, surgical consent, pre-op evaluation, timeout performed, IV checked, risks and benefits discussed and monitors and equipment checked  Epidural Patient position: sitting Prep: site prepped and draped and DuraPrep Patient monitoring: continuous pulse ox and blood pressure Approach: midline Location: L4-L5 Injection technique: LOR saline  Needle:  Needle type: Tuohy  Needle gauge: 17 G Needle length: 9 cm and 9 Needle insertion depth: 7 cm Catheter type: closed end flexible Catheter size: 19 Gauge Catheter at skin depth: 13 (12cm initially at skin. Advanced to 13 when laid in lat decubitus posiotion.) cm Test dose: negative  Assessment Events: blood not aspirated, injection not painful, no injection resistance, negative IV test and no paresthesia

## 2015-05-14 LAB — CBC
HCT: 31.6 % — ABNORMAL LOW (ref 36.0–46.0)
HEMOGLOBIN: 10.6 g/dL — AB (ref 12.0–15.0)
MCH: 28.4 pg (ref 26.0–34.0)
MCHC: 33.5 g/dL (ref 30.0–36.0)
MCV: 84.7 fL (ref 78.0–100.0)
PLATELETS: 139 10*3/uL — AB (ref 150–400)
RBC: 3.73 MIL/uL — ABNORMAL LOW (ref 3.87–5.11)
RDW: 15.4 % (ref 11.5–15.5)
WBC: 11.2 10*3/uL — ABNORMAL HIGH (ref 4.0–10.5)

## 2015-05-14 MED ORDER — IBUPROFEN 600 MG PO TABS: 600.0000 mg | ORAL_TABLET | Freq: Four times a day (QID) | ORAL | Status: DC

## 2015-05-14 NOTE — Lactation Note (Signed)
This note was copied from a baby's chart. Lactation Consultation Note; Experienced BF mom reports baby has been latching well. Nursed for 40 min about 1 1/2 hours ago. Reports no pain with latch. BF brochure given with resources for support after DC. Reviewed OP appointments and BFSG as resources. No questions at present. Baby sucking on hands so suggested latching again. Baby latched well. Mom reports no pain with latch. To call prn  Patient Name: Jamie Maryln GottronCaitlin Cantrell [id number]'s Date: 05/14/2015 Reason for consult: Initial assessment   Maternal Data Formula Feeding for Exclusion: No Has patient been taught Hand Expression?: Yes Does the patient have breastfeeding experience prior to this delivery?: Yes  Feeding Feeding Type: Breast Fed  LATCH Score/Interventions Latch: Grasps breast easily, tongue down, lips flanged, rhythmical sucking.  Audible Swallowing: A few with stimulation  Type of Nipple: Everted at rest and after stimulation  Comfort (Breast/Nipple): Soft / non-tender     Hold (Positioning): No assistance needed to correctly position infant at breast.  LATCH Score: 9  Lactation Tools Discussed/Used     Consult Status Consult Status: Complete    Jamie Cantrell, Jamie Cantrell 05/14/2015, 12:01 PM

## 2015-05-14 NOTE — Discharge Summary (Signed)
OB Discharge Summary     Patient Name: Jamie Cantrell DOB: 02-09-1980 MRN: [id number]  Date of admission: 05/13/2015 Delivering MD: Huel CoteICHARDSON, Lasheba Stevens   Date of discharge: 05/14/2015  Admitting diagnosis: INDUCTION Intrauterine pregnancy: 111w3d     Secondary diagnosis:  Active Problems:   Post term pregnancy over 40 weeks   NSVD (normal spontaneous vaginal delivery)  Additional problems: none     Discharge diagnosis: Term Pregnancy Delivered                                                                                                Post partum procedures:none  Augmentation: AROM and Pitocin  Complications: None  Hospital course:  Induction of Labor With Vaginal Delivery   35 y.o. yo [id number] at 731w3d was admitted to the hospital 05/13/2015 for induction of labor.  Indication for induction: Postdates.  Patient had an uncomplicated labor course as follows: Membrane Rupture Time/Date: 8:46 AM ,05/13/2015   Intrapartum Procedures: Episiotomy: None [1]                                         Lacerations:  1st degree [2]  Patient had delivery of a Viable infant.  Information for the patient's newborn:  Alvin CritchleyRogers, Boy Nilani [[id number]][[id number]]  Delivery Method: Vaginal, Spontaneous Delivery (Filed from Delivery Summary)   05/13/2015  Details of delivery can be found in separate delivery note.  Patient had a routine postpartum course. Patient is discharged home 05/14/2015.   Physical exam  Filed Vitals:   05/13/15 1825 05/13/15 1943 05/14/15 0015 05/14/15 0625  BP: 123/60 103/59 121/73 120/64  Pulse:  80 75 60  Temp: 98.9 F (37.2 C) 98.6 F (37 C) 98 F (36.7 C) 98.1 F (36.7 C)  TempSrc: Oral Oral Oral Oral  Resp:  20 18 18   Height:      Weight:       General: alert and cooperative Lochia: appropriate Uterine Fundus: firm  Labs: Lab Results  Component Value Date   WBC 11.2* 05/14/2015   HGB 10.6* 05/14/2015   HCT 31.6* 05/14/2015   MCV 84.7 05/14/2015   PLT 139*  05/14/2015   CMP Latest Ref Rng 04/22/2014  Glucose 70 - 99 mg/dL 130(Q124(H)  BUN 6 - 23 mg/dL 10  Creatinine 6.570.50 - 8.461.10 mg/dL 9.620.54  Sodium 952135 - 841145 mmol/L 136  Potassium 3.5 - 5.1 mmol/L 3.9  Chloride 96 - 112 mmol/L 104  CO2 19 - 32 mmol/L 25  Calcium 8.4 - 10.5 mg/dL 8.9  Total Protein 6.0 - 8.3 g/dL 7.7  Total Bilirubin 0.3 - 1.2 mg/dL 0.6  Alkaline Phos 39 - 117 U/L 50  AST 0 - 37 U/L 45(H)  ALT 0 - 35 U/L 80(H)    Discharge instruction: per After Visit Summary and "Baby and Me Booklet".  After visit meds:    Medication List    ASK your doctor about these medications        acetaminophen 500 MG tablet  Commonly  known as:  TYLENOL  Take 1,000 mg by mouth every 6 (six) hours as needed for mild pain or moderate pain.     PRENATAL VITAMIN PO  Take 1 tablet by mouth daily.        Diet: routine diet  Activity: Advance as tolerated. Pelvic rest for 6 weeks.   Outpatient follow up:6 weeks Follow up Appt:No future appointments. Follow up Visit:No Follow-up on file.  Postpartum contraception: Undecided  Newborn Data: Live born female  Birth Weight: 7 lb 4 oz (3289 g) APGAR: 8, 9  Baby Feeding: Breast Disposition:home with mother   05/14/2015 Oliver Pila, MD

## 2015-05-14 NOTE — Progress Notes (Signed)
Post Partum Day 1 Subjective: no complaints, up ad lib and tolerating PO  Objective: Blood pressure 120/64, pulse 60, temperature 98.1 F (36.7 C), temperature source Oral, resp. rate 18, height 5\' 10"  (1.778 m), weight 97.523 kg (215 lb), last menstrual period 08/03/2014, unknown if currently breastfeeding.  Physical Exam:  General: alert and cooperative Lochia: appropriate Uterine Fundus: firm    Recent Labs  05/13/15 0735 05/14/15 0520  HGB 13.1 10.6*  HCT 38.9 31.6*    Assessment/Plan: Discharge home   LOS: 1 day   Charlissa Petros W 05/14/2015, 7:12 AM

## 2015-05-14 NOTE — Anesthesia Postprocedure Evaluation (Signed)
Anesthesia Post Note  Patient: Jamie Cantrell  Procedure(s) Performed: * No procedures listed *  Anesthesia Type: Epidural Level of consciousness: awake and alert and oriented Pain management: pain level controlled Vital Signs Assessment: post-procedure vital signs reviewed and stable Respiratory status: spontaneous breathing Cardiovascular status: blood pressure returned to baseline Postop Assessment: no headache, no backache, patient able to bend at knees, no signs of nausea or vomiting and adequate PO intake Anesthetic complications: no     Last Vitals:  Filed Vitals:   05/14/15 0015 05/14/15 0625  BP: 121/73 120/64  Pulse: 75 60  Temp: 36.7 C 36.7 C  Resp: 18 18    Last Pain:  Filed Vitals:   05/14/15 0626  PainSc: 3    Pain Goal: Patients Stated Pain Goal: 0 (05/13/15 2208)               Harjot Dibello

## 2015-05-20 NOTE — Progress Notes (Signed)
Post discharge chart review completed.  

## 2016-07-02 ENCOUNTER — Encounter (HOSPITAL_COMMUNITY): Payer: Self-pay

## 2016-07-02 ENCOUNTER — Emergency Department (HOSPITAL_COMMUNITY): Payer: BLUE CROSS/BLUE SHIELD

## 2016-07-02 ENCOUNTER — Emergency Department (HOSPITAL_COMMUNITY)
Admission: EM | Admit: 2016-07-02 | Discharge: 2016-07-02 | Disposition: A | Discharge status: Home / Self Care | Payer: BLUE CROSS/BLUE SHIELD | Attending: Emergency Medicine | Admitting: Emergency Medicine

## 2016-07-02 DIAGNOSIS — F17210 Nicotine dependence, cigarettes, uncomplicated: Secondary | ICD-10-CM | POA: Diagnosis not present

## 2016-07-02 DIAGNOSIS — Z79899 Other long term (current) drug therapy: Secondary | ICD-10-CM | POA: Insufficient documentation

## 2016-07-02 DIAGNOSIS — G43909 Migraine, unspecified, not intractable, without status migrainosus: Secondary | ICD-10-CM | POA: Insufficient documentation

## 2016-07-02 DIAGNOSIS — G43109 Migraine with aura, not intractable, without status migrainosus: Secondary | ICD-10-CM

## 2016-07-02 DIAGNOSIS — H538 Other visual disturbances: Secondary | ICD-10-CM | POA: Diagnosis present

## 2016-07-02 LAB — CBC
HEMATOCRIT: 40.3 % (ref 36.0–46.0)
Hemoglobin: 13.8 g/dL (ref 12.0–15.0)
MCH: 30.9 pg (ref 26.0–34.0)
MCHC: 34.2 g/dL (ref 30.0–36.0)
MCV: 90.2 fL (ref 78.0–100.0)
Platelets: 186 10*3/uL (ref 150–400)
RBC: 4.47 MIL/uL (ref 3.87–5.11)
RDW: 13.5 % (ref 11.5–15.5)
WBC: 8.3 10*3/uL (ref 4.0–10.5)

## 2016-07-02 LAB — BASIC METABOLIC PANEL
Anion gap: 6 (ref 5–15)
BUN: 10 mg/dL (ref 6–20)
CHLORIDE: 106 mmol/L (ref 101–111)
CO2: 23 mmol/L (ref 22–32)
Calcium: 9 mg/dL (ref 8.9–10.3)
Creatinine, Ser: 0.56 mg/dL (ref 0.44–1.00)
GFR calc Af Amer: >60 mL/min (ref >60)
GLUCOSE: 100 mg/dL — AB (ref 65–99)
POTASSIUM: 4.1 mmol/L (ref 3.5–5.1)
Sodium: 135 mmol/L (ref 135–145)

## 2016-07-02 LAB — URINALYSIS, ROUTINE W REFLEX MICROSCOPIC
BILIRUBIN URINE: NEGATIVE
GLUCOSE, UA: NEGATIVE mg/dL
HGB URINE DIPSTICK: NEGATIVE
KETONES UR: NEGATIVE mg/dL
Leukocytes, UA: NEGATIVE
Nitrite: NEGATIVE
PH: 5 (ref 5.0–8.0)
Protein, ur: NEGATIVE mg/dL
Specific Gravity, Urine: 1.026 (ref 1.005–1.030)

## 2016-07-02 LAB — I-STAT BETA HCG BLOOD, ED (MC, WL, AP ONLY): I-stat hCG, quantitative: <5 m[IU]/mL (ref <5)

## 2016-07-02 LAB — CBG MONITORING, ED: GLUCOSE-CAPILLARY: 87 mg/dL (ref 65–99)

## 2016-07-02 NOTE — ED Provider Notes (Signed)
[facility]-EMERGENCY DEPT Provider Note   CSN: [id number] Arrival date & time: 07/02/16  1223     History   Chief Complaint Chief Complaint  Patient presents with  . Visual Field Change  . Weakness  . Aphasia    HPI Jamie Cantrell is a 36 y.o. female.  HPI   36 year old female presents today with complaints of visual changes.  Patient notes that this morning around 10:30 AM she had acute onset of bilateral vision changes.  She notes this was like a bright light shining towards her and she was unable to see pupils faces clearly.  She notes that this migrated through her vision.  She notes the symptoms worsened on the left side to the point she was unable to distinguish anything.  She notes at the same time she had some difficulty finding words. She notes the symptoms lasted approximately 40 minutes before resolving.  She notes she still has some vague sensation of visual disturbance, but notes her vision is intact.  She denied any numbness or tingling with these episodes, reports she did have some minor dizziness. She notes weakness in the bilateral upper and lower extremities, nonfocal. Patient reports she is a smoker with a 6 pack-year history.  She denies any drug use, reports she drinks 2-3 cups of coffee per day, denies any trauma headache neck stiffness or infectious etiology.   Past Medical History:  Diagnosis Date  . Hx of chlamydia infection   . Kidney stone     Patient Active Problem List   Diagnosis Date Noted  . Post term pregnancy over 40 weeks 05/13/2015  . NSVD (normal spontaneous vaginal delivery) 05/13/2015    History reviewed. No pertinent surgical history.  OB History    Gravida Para Term Preterm AB Living   3 2 2   1 2    SAB TAB Ectopic Multiple Live Births   1     0 2       Home Medications    Prior to Admission medications   Medication Sig Start Date End Date Taking? Authorizing Provider  ibuprofen (ADVIL,MOTRIN) 200 MG tablet Take 400 mg by mouth  every 6 (six) hours as needed for mild pain or moderate pain.   Yes [provider]  loratadine (CLARITIN) 10 MG tablet Take 10 mg by mouth daily as needed for allergies.   Yes [provider]  ibuprofen (ADVIL,MOTRIN) 600 MG tablet Take 1 tablet (600 mg total) by mouth every 6 (six) hours. Patient not taking: Reported on 07/02/2016 05/14/15   Huel Cote, MD    Family History History reviewed. No pertinent family history.  Social History Social History  Substance Use Topics  . Smoking status: Current Every Day Smoker    Types: Cigarettes  . Smokeless tobacco: Never Used  . Alcohol use Yes     Comment: social      Allergies   Penicillins   Review of Systems Review of Systems  All other systems reviewed and are negative.  Physical Exam Updated Vital Signs BP 131/78   Pulse 80   Temp 98.3 F (36.8 C) (Oral)   Resp 16   SpO2 100%   Physical Exam  Constitutional: She is oriented to person, place, and time. She appears well-developed and well-nourished.  HENT:  Head: Normocephalic and atraumatic.  Eyes: Conjunctivae are normal. Pupils are equal, round, and reactive to light. Right eye exhibits no discharge. Left eye exhibits no discharge. No scleral icterus.  Neck: Normal range of  motion. No JVD present. No tracheal deviation present.  Pulmonary/Chest: Effort normal. No stridor.  Neurological: She is alert and oriented to person, place, and time. She has normal strength. No cranial nerve deficit or sensory deficit. Coordination normal. GCS eye subscore is 4. GCS verbal subscore is 5. GCS motor subscore is 6.  Psychiatric: She has a normal mood and affect. Her behavior is normal. Judgment and thought content normal.  Nursing note and vitals reviewed.   ED Treatments / Results  Labs (all labs ordered are listed, but only abnormal results are displayed) Labs Reviewed  BASIC METABOLIC PANEL - Abnormal; Notable for the following:       Result Value    Glucose, Bld 100 (*)    All other components within normal limits  CBC  URINALYSIS, ROUTINE W REFLEX MICROSCOPIC  CBG MONITORING, ED  CBG MONITORING, ED  I-STAT BETA HCG BLOOD, ED ([facility], [facility], [facility] ONLY)    EKG  EKG Interpretation  Date/Time:  Friday July 02 2016 13:30:01 EDT Ventricular Rate:  63 PR Interval:    QRS Duration: 91 QT Interval:  413 QTC Calculation: 423 R Axis:   4 Text Interpretation:  Sinus rhythm No old tracing to compare Confirmed by Rolan BuccoBelfi, Melanie [phone]([id number]) on 07/02/2016 9:31:20 PM       Radiology Mr Brain Wo Contrast  Result Date: 07/02/2016 CLINICAL DATA:  36 y/o F; episode of sudden vision chain, weakness in arms bilaterally, and aphasia. EXAM: MRI HEAD WITHOUT CONTRAST TECHNIQUE: Multiplanar, multiecho pulse sequences of the brain and surrounding structures were obtained without intravenous contrast. COMPARISON:  11/23/2006 CT of the head FINDINGS: Brain: No acute infarction, hemorrhage, hydrocephalus, extra-axial collection or mass lesion. Small left paramedian posterior frontal developmental venous anomaly. Vascular: Normal flow voids. Skull and upper cervical spine: Normal marrow signal. Sinuses/Orbits: Negative. Other: None. IMPRESSION: 1. No acute intracranial abnormality identified. 2. Unremarkable MRI of the brain for age. Electronically Signed   By: Mitzi HansenLance  Furusawa-Stratton M.D.   On: 07/02/2016 20:22    Procedures Procedures (including critical care time)  Medications Ordered in ED Medications - No data to display   Initial Impression / Assessment and Plan / ED Course  I have reviewed the triage vital signs and the nursing notes.  Pertinent labs & imaging results that were available during my care of the patient were reviewed by me and considered in my medical decision making (see chart for details).     Final Clinical Impressions(s) / ED Diagnoses   Final diagnoses:  Complicated migraine    Labs: I stat beta HGC, BMP, CBC, urinalysis,  CBC  Imaging: MRI brain without  Consults:  Therapeutics:  Discharge Meds:   Assessment/Plan: 36 year old female presents today with visual changes.  Her presentation is most consistent with complicated migraine.  I discussed the case with on-call neurologist who agreed this was highly unlikely to be any acute life-threatening etiology including TIA or stroke.  MRI without contrast ordered which showed no significant findings.  Patient with no vision changes neurologic deficits here.  She will be referred to outpatient neurology, given return precautions.  She verbalized understanding and agreement to this plan and had no further questions or concerns.      New Prescriptions Discharge Medication List as of 07/02/2016  9:34 PM       Eyvonne MechanicHedges, Ercilia Bettinger, PA-C 07/02/16 2341    Rolan BuccoBelfi, Melanie, MD 07/02/16 [id number]

## 2016-07-02 NOTE — Discharge Instructions (Signed)
Please read attached information. If you experience any new or worsening signs or symptoms please return to the emergency room for evaluation. Please follow-up with your primary care provider or specialist as discussed.  °

## 2016-07-02 NOTE — ED Triage Notes (Signed)
Pt states hat she was at work and around 1030, her vision suddenly changed. She states that she couldn't see faces or focus on anything. She also endorses weakness in her arms bilaterally, and aphasia that last 30-45 minutes. Symptoms have mostly resolved, but pt still endorses some weakness and some black spots in her vision. A&Ox4 at this time. Ambulatory.

## 2016-08-23 ENCOUNTER — Encounter: Payer: Self-pay | Admitting: Diagnostic Neuroimaging

## 2016-08-23 ENCOUNTER — Ambulatory Visit (INDEPENDENT_AMBULATORY_CARE_PROVIDER_SITE_OTHER): Payer: BLUE CROSS/BLUE SHIELD | Admitting: Diagnostic Neuroimaging

## 2016-08-23 ENCOUNTER — Encounter (INDEPENDENT_AMBULATORY_CARE_PROVIDER_SITE_OTHER): Payer: Self-pay

## 2016-08-23 VITALS — BP 120/81 | HR 78 | Ht 70.0 in | Wt 216.8 lb

## 2016-08-23 DIAGNOSIS — R209 Unspecified disturbances of skin sensation: Secondary | ICD-10-CM | POA: Diagnosis not present

## 2016-08-23 DIAGNOSIS — R42 Dizziness and giddiness: Secondary | ICD-10-CM

## 2016-08-23 DIAGNOSIS — H539 Unspecified visual disturbance: Secondary | ICD-10-CM

## 2016-08-23 NOTE — Patient Instructions (Signed)
Thank you for coming to see [place] at [facility]. I hope we have been able to provide you high quality care today.  You may receive a patient satisfaction survey over the next few weeks. We would appreciate your feedback and comments so that we may continue to improve ourselves and the health of our patients.  - check additional testing (ultrasound of heart and carotid arteries)  - check lab testing   ~~~~~~~~~~~~~~~~~~~~~~~~~~~~~~~~~~~~~~~~~~~~~~~~~~~~~~~~~~~~~~~~~  DR. Anhad Sheeley'S GUIDE TO HAPPY AND HEALTHY LIVING These are some of my general health and wellness recommendations. Some of them may apply to you better than others. Please use common sense as you try these suggestions and feel free to ask me any questions.   ACTIVITY/FITNESS Mental, social, emotional and physical stimulation are very important for brain and body health. Try learning a new activity (arts, music, language, sports, games).  Keep moving your body to the best of your abilities. You can do this at home, inside or outside, the park, community center, gym or anywhere you like. Consider a physical therapist or personal trainer to get started. Consider the app Sworkit. Fitness trackers such as smart-watches, smart-phones or Fitbits can help as well.   NUTRITION Eat more plants: colorful vegetables, nuts, seeds and berries.  Eat less sugar, salt, preservatives and processed foods.  Avoid toxins such as cigarettes and alcohol.  Drink water when you are thirsty. Warm water with a slice of lemon is an excellent morning drink to start the day.  Consider these websites for more information The Nutrition Source (https://www.henry-hernandez.biz/) [employer] Nutrition (WindowBlog.ch)   RELAXATION Consider practicing mindfulness meditation or other relaxation techniques such as deep breathing, prayer, yoga, tai chi, massage. See website mindful.org or the apps  Headspace or Calm to help get started.   SLEEP Try to get at least 7-8+ hours sleep per day. Regular exercise and reduced caffeine will help you sleep better. Practice good sleep hygeine techniques. See website sleep.org for more information.   PLANNING Prepare estate planning, living will, healthcare POA documents. Sometimes this is best planned with the help of an attorney. Theconversationproject.org and agingwithdignity.org are excellent resources.

## 2016-08-23 NOTE — Progress Notes (Signed)
[facility]  PATIENT: Jamie Cantrell DOB: 1980/08/04  REFERRING CLINICIAN: Fredderick Phenix, M HISTORY FROM: patient and chart review REASON FOR VISIT: new consult    HISTORICAL  CHIEF COMPLAINT:  Chief Complaint  Patient presents with  . 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 This started at 10am. Patient stood up and walked around the office, and noticed that she was having other visual disturbance. She saw a rainbow and shimmering colors, unable to see certain patches of her visual field, with geometric shapes along the edge of her blind spots. This seemed to be affecting her left eye or left visual field. She also had some dizziness and word finding difficulties. No associated headaches. Symptoms lasted for approximately 1-1/2 hours. Patient went to the emergency room for further evaluation.  MRI of the brain was unremarkable. No acute findings. Lab testing and EKG were unremarkable. Patient referred here for further follow-up.  No specific prodromal, triggering or aggravating factors. Patient denies any prior history of migraine headaches. No family history of migraine.   REVIEW OF SYSTEMS: Full 14 system review of systems performed and negative with exception of: Confusion headache numbness weakness dizziness anxiety decreased energy cramps flushing feeling hot blurred vision eye pain chills fatigue palpitations ringing in ears spinning sensation urination problems diarrhea.     ALLERGIES: Allergies  Allergen Reactions  . Penicillins Rash    Has patient had a PCN  reaction causing immediate rash, facial/tongue/throat swelling, SOB or lightheadedness with hypotension: Yes Has patient had a PCN reaction causing severe rash involving mucus membranes or skin necrosis: Yes Has patient had a PCN reaction that required hospitalization No Has patient had a PCN reaction occurring within the last 10 years: Yes If all of the above answers are "NO", then may proceed with Cephalosporin use.     HOME MEDICATIONS: Outpatient Medications Prior to Visit  Medication Sig Dispense Refill  . ibuprofen (ADVIL,MOTRIN) 200 MG tablet Take 400 mg by mouth every 6 (six) hours as needed for mild pain or moderate pain.    Marland Kitchen loratadine (CLARITIN) 10 MG tablet Take 10 mg by mouth daily as needed for allergies.    Marland Kitchen ibuprofen (ADVIL,MOTRIN) 600 MG tablet Take 1 tablet (600 mg total) by mouth every 6 (six) hours. (Patient not taking: Reported on 08/23/2016) 30 tablet 0   No facility-administered medications prior to visit.     PAST MEDICAL HISTORY: Past Medical History:  Diagnosis Date  . Hx of chlamydia infection   . Kidney stone     PAST SURGICAL HISTORY: History reviewed. No pertinent surgical history.  FAMILY HISTORY: History reviewed. No pertinent family history.  SOCIAL HISTORY:  Social History   Social History  . Marital status: Single    Spouse name: N/A  . Number of children: N/A  . Years of education: N/A   Occupational History  . Not on file.   Social History Main Topics  . Smoking status: Current Every Day Smoker    Packs/day: 0.25    Types: Cigarettes  . Smokeless tobacco: Never Used     Comment:  5 per day  . Alcohol use 0.6 oz/week    1 Glasses of wine per week     Comment: social   . Drug use: No  . Sexual activity: Not on file   Other Topics Concern  . Not on file   Social History Narrative  . No narrative on file     PHYSICAL EXAM  GENERAL EXAM/CONSTITUTIONAL: Vitals:  Vitals:   08/23/16 1520  BP: 120/81  Pulse: 78    Weight: 216 lb 12.8 oz (98.3 kg)  Height: 5\' 10"  (1.778 m)     Body mass index is 31.11 kg/m.  Visual Acuity Screening   Right eye Left eye Both eyes  Without correction:     With correction: 20/30 20/30      Patient is in no distress; well developed, nourished and groomed; neck is supple  CARDIOVASCULAR:  Examination of carotid arteries is normal; no carotid bruits  Regular rate and rhythm, no murmurs  Examination of peripheral vascular system by observation and palpation is normal  EYES:  Ophthalmoscopic exam of optic discs and posterior segments is normal; no papilledema or hemorrhages  MUSCULOSKELETAL:  Gait, strength, tone, movements noted in Neurologic exam below  NEUROLOGIC: MENTAL STATUS:  No flowsheet data found.  awake, alert, oriented to person, place and time  recent and remote memory intact  normal attention and concentration  language fluent, comprehension intact, naming intact,   fund of knowledge appropriate  CRANIAL NERVE:   2nd - no papilledema on fundoscopic exam  2nd, 3rd, 4th, 6th - pupils equal and reactive to light, visual fields full to confrontation, extraocular muscles intact, no nystagmus  5th - facial sensation symmetric  7th - facial strength symmetric  8th - hearing intact  9th - palate elevates symmetrically, uvula midline  11th - shoulder shrug symmetric  12th - tongue protrusion midline  MOTOR:   normal bulk and tone, full strength in the BUE, BLE  SENSORY:   normal and symmetric to light touch, temperature, vibration  COORDINATION:   finger-nose-finger, fine finger movements normal  REFLEXES:   deep tendon reflexes present and symmetric  GAIT/STATION:   narrow based gait    DIAGNOSTIC DATA (LABS, IMAGING, TESTING) - I reviewed patient records, labs, notes, testing and imaging myself where available.  Lab Results  Component Value Date   WBC 8.3 07/02/2016   HGB 13.8 07/02/2016   HCT 40.3  07/02/2016   MCV 90.2 07/02/2016   PLT 186 07/02/2016      Component Value Date/Time   NA 135 07/02/2016 1428   K 4.1 07/02/2016 1428   CL 106 07/02/2016 1428   CO2 23 07/02/2016 1428   GLUCOSE 100 (H) 07/02/2016 1428   BUN 10 07/02/2016 1428   CREATININE 0.56 07/02/2016 1428   CALCIUM 9.0 07/02/2016 1428   PROT 7.7 04/22/2014 1531   ALBUMIN 4.5 04/22/2014 1531   AST 45 (H) 04/22/2014 1531   ALT 80 (H) 04/22/2014 1531   ALKPHOS 50 04/22/2014 1531   BILITOT 0.6 04/22/2014 1531   GFRNONAA >60 07/02/2016 1428   GFRAA >60 07/02/2016 1428   No results found for: CHOL, HDL, LDLCALC, LDLDIRECT, TRIG, CHOLHDL No results found for: NWGN5AHGBA1C No results found for: VITAMINB12 No results found for: TSH   04/15/04 CT head / cervical [I reviewed images myself and agree with interpretation. -VRP]  1. Right subdural hematoma with probable component of subarachnoid blood along the high right frontal convexity.  2. Shear hemorrhage in the left  frontal and temporal lobes.  3. Query a small amount of layering acute hemorrhage on the tentorium.  4. No skull fracture identified.   04/15/04 CT cervical  1. No evidence for acute fracture or subluxation involving the cervical spine.  2. Straightening of the normal cervical lordosis. Please see above.   07/02/16 MRI brain [I reviewed images myself and agree with interpretation. -VRP]  1. No acute intracranial abnormality identified. 2. Unremarkable MRI of the brain for age.     ASSESSMENT AND PLAN  36 y.o. year old female here with new-onset visual disturbance with dizziness and word finding difficulty, with findings suspicious for complicated migraine. However patient never had migraine before. Patient did not have headache. Therefore will complete TIA workup.   Ddx: complicated migraine vs TIA (less likely)  1. Transient vision disturbance   2. Vision changes   3. Abnormal sensation of upper extremity   4. Dizziness      PLAN: - TIA  workup (TTE, carotid u/s, lipids, A1c) - check B12, TSH - cut down tobacco use  Orders Placed This Encounter  Procedures  . Lipid Panel  . Hemoglobin A1c  . TSH  . Vitamin B12  . ECHOCARDIOGRAM COMPLETE   Return in about 3 months (around 11/23/2016).    Suanne Marker, MD 08/23/2016, 4:00 PM Certified in Neurology, Neurophysiology and Neuroimaging  [facility] [address] [place], [place] [address] [phone]

## 2016-08-24 LAB — LIPID PANEL
CHOLESTEROL TOTAL: 188 mg/dL (ref 100–199)
Chol/HDL Ratio: 4.7 ratio — ABNORMAL HIGH (ref 0.0–4.4)
HDL: 40 mg/dL (ref >39)
LDL CALC: 71 mg/dL (ref 0–99)
Triglycerides: 385 mg/dL — ABNORMAL HIGH (ref 0–149)
VLDL CHOLESTEROL CAL: 77 mg/dL — AB (ref 5–40)

## 2016-08-24 LAB — HEMOGLOBIN A1C
Est. average glucose Bld gHb Est-mCnc: 94 mg/dL
Hgb A1c MFr Bld: 4.9 % (ref 4.8–5.6)

## 2016-08-24 LAB — TSH: TSH: 1.34 u[IU]/mL (ref 0.450–4.500)

## 2016-08-24 LAB — VITAMIN B12: Vitamin B-12: 450 pg/mL (ref 232–1245)

## 2016-08-31 ENCOUNTER — Telehealth: Payer: Self-pay | Admitting: *Deleted

## 2016-08-31 NOTE — Telephone Encounter (Signed)
Spoke with patient and informed her that her labs showed High triglycerides and other elevated lipids. Otherwise her labs are okay. Advised she needs to follow up with her PCP.  Patient stated she does not have a PCP. This RN strongly advised she get established with Family practice to be followed for her abnormal labs. Patient inquired exactly what do her labs mean. Discussed that some of the fats in her blood stream are elevated which can lead to heart disease and other issues such as hypertension. Advised her that diet control, weight loss and exercise can bring those fats within a desired level. Patient verbalized understanding, appreciation of call.

## 2016-10-28 ENCOUNTER — Telehealth (HOSPITAL_COMMUNITY): Payer: Self-pay | Admitting: Diagnostic Neuroimaging

## 2016-10-28 NOTE — Telephone Encounter (Signed)
8[facility]14 [facility]  10/25/16 Called pt and spoke with her and she voiced that she would CB , she is not able to schedule at this time

## 2016-11-24 ENCOUNTER — Ambulatory Visit: Payer: BLUE CROSS/BLUE SHIELD | Admitting: Diagnostic Neuroimaging

## 2016-11-25 ENCOUNTER — Encounter: Payer: Self-pay | Admitting: Diagnostic Neuroimaging

## 2017-06-01 ENCOUNTER — Ambulatory Visit: Payer: Self-pay | Admitting: Urgent Care

## 2017-06-01 ENCOUNTER — Encounter: Payer: Self-pay | Admitting: Urgent Care

## 2017-06-01 VITALS — BP 162/93 | HR 94 | Temp 98.6°F | Resp 17 | Ht 68.5 in | Wt 207.0 lb

## 2017-06-01 DIAGNOSIS — R0989 Other specified symptoms and signs involving the circulatory and respiratory systems: Secondary | ICD-10-CM

## 2017-06-01 DIAGNOSIS — R7989 Other specified abnormal findings of blood chemistry: Secondary | ICD-10-CM

## 2017-06-01 DIAGNOSIS — G44219 Episodic tension-type headache, not intractable: Secondary | ICD-10-CM

## 2017-06-01 DIAGNOSIS — R945 Abnormal results of liver function studies: Secondary | ICD-10-CM

## 2017-06-01 DIAGNOSIS — R002 Palpitations: Secondary | ICD-10-CM

## 2017-06-01 DIAGNOSIS — Z8249 Family history of ischemic heart disease and other diseases of the circulatory system: Secondary | ICD-10-CM

## 2017-06-01 DIAGNOSIS — R591 Generalized enlarged lymph nodes: Secondary | ICD-10-CM

## 2017-06-01 DIAGNOSIS — R42 Dizziness and giddiness: Secondary | ICD-10-CM

## 2017-06-01 MED ORDER — LORATADINE 10 MG PO TABS: 10.0000 mg | ORAL_TABLET | Freq: Every day | ORAL | 1 refills | Status: AC | PRN

## 2017-06-01 NOTE — Progress Notes (Signed)
MRN: [id number] DOB: November 08, 1980  Subjective:   Jamie Cantrell is a 37 y.o. female presenting for 6 week history of lymph node soreness and swelling under jaw/upper neck bilaterally. Symptoms started after patient's daughter got strep throat. Has also had intermittent posterior headache/upper neck pain, occasional night sweating. Is having ~2 headaches per week for the past 6 months. Has had intermittent and randomly occurring dizziness, belly pain, heart palpitations. Denies history of allergies, does not take medications for this. Denies history of HTN. Denies fever, decreased appetite. Patient has lost weight but admits that it is intentional through diet and healthy lifestyle. Denies smoking cigarettes. Patient quit smoking last September 2018. Denies vision changes, stuffy nose, ear pain, sore throat, chest pain, n/v, hematuria, rashes.   Jamie Cantrell has a current medication list which includes the following prescription(s): ibuprofen and loratadine. Also is allergic to penicillins.  Jamie Cantrell  has a past medical history of chlamydia infection and Kidney stone. Denies past surgical history. Denies family history of lymphoma. Father has a history of atrial fibrillation.   Objective:   Vitals: BP (!) 162/93   Pulse 94   Temp 98.6 F (37 C) (Oral)   Resp 17   Ht 5' 8.5" (1.74 m)   Wt 207 lb (93.9 kg)   SpO2 98%   BMI 31.02 kg/m   BP Readings from Last 3 Encounters:  06/01/17 (!) 162/93  08/23/16 120/81  07/02/16 131/78    Wt Readings from Last 3 Encounters:  06/01/17 207 lb (93.9 kg)  08/23/16 216 lb 12.8 oz (98.3 kg)  05/13/15 215 lb (97.5 kg)    Physical Exam  Constitutional: She is oriented to person, place, and time. She appears well-developed and well-nourished.  HENT:  Right Ear: Tympanic membrane normal.  Left Ear: Tympanic membrane normal.  Nose: No mucosal edema or rhinorrhea.  Throat with post-nasal drainage.  Eyes: Pupils are equal, round, and reactive to light. EOM  are normal. Right eye exhibits no discharge. Left eye exhibits no discharge. No scleral icterus.  Cardiovascular: Normal rate, regular rhythm and intact distal pulses. Exam reveals no gallop and no friction rub.  No murmur heard. Pulmonary/Chest: No respiratory distress. She has no wheezes. She has no rales.  Abdominal: Soft. Bowel sounds are normal. She exhibits no distension and no mass. There is no tenderness. There is no rebound and no guarding.  Musculoskeletal: She exhibits no edema.  Lymphadenopathy:       Head (right side): No submental, no tonsillar, no preauricular, no posterior auricular and no occipital adenopathy present.       Head (left side): No submental, no tonsillar, no preauricular, no posterior auricular and no occipital adenopathy present.    She has no cervical adenopathy.       Right cervical: No superficial cervical, no deep cervical and no posterior cervical adenopathy present.      Left cervical: No deep cervical and no posterior cervical adenopathy present.    She has no axillary adenopathy.       Right: No supraclavicular adenopathy present.       Left: No supraclavicular adenopathy present.  Tenderness of submandibular lymph nodes.  Neurological: She is alert and oriented to person, place, and time.  Skin: Skin is warm and dry. No rash noted. No erythema. No pallor.  Psychiatric: She has a normal mood and affect.   Assessment and Plan :   Tenderness of lymph node - Plan: CBC, Comprehensive metabolic panel, Thyroid Panel With TSH, Sedimentation Rate,  Epstein-Barr virus VCA antibody panel  Frequent episodic tension-type headache - Plan: Thyroid Panel With TSH  Dizziness  Palpitations  Family history of atrial fibrillation  Unclear etiology.  Will perform lab work-up.  Patient declined ultrasound of neck and soft tissue, lymph nodes.  For now patient will use Tylenol and ibuprofen for her lymph node pain.  Recommended she hydrate very well, continue  practicing healthy diet.  She is to restart her allergy medications.  We will follow-up with results.  Patient will continue to monitor her blood pressure at home.  Recheck parameters given.  Wallis Bamberg, PA-C Primary Care at [facility] [phone] 06/01/2017  2:52 PM

## 2017-06-01 NOTE — Patient Instructions (Addendum)
You may take  Tylenol with ibuprofen 400-600mg  every 6 hours for pain and inflammation.     Lymphadenopathy Lymphadenopathy refers to swollen or enlarged lymph glands, also called lymph nodes. Lymph glands are part of your body's defense (immune) system, which protects the body from infections, germs, and diseases. Lymph glands are found in many locations in your body, including the neck, underarm, and groin. Many things can cause lymph glands to become enlarged. When your immune system responds to germs, such as viruses or bacteria, infection-fighting cells and fluid build up. This causes the glands to grow in size. Usually, this is not something to worry about. The swelling and any soreness often go away without treatment. However, swollen lymph glands can also be caused by a number of diseases. Your health care provider may do various tests to help determine the cause. If the cause of your swollen lymph glands cannot be found, it is important to monitor your condition to make sure the swelling goes away. Follow these instructions at home: Watch your condition for any changes. The following actions may help to lessen any discomfort you are feeling:  Get plenty of rest.  Take medicines only as directed by your health care provider. Your health care provider may recommend over-the-counter medicines for pain.  Apply moist heat compresses to the site of swollen lymph nodes as directed by your health care provider. This can help reduce any pain.  Check your lymph nodes daily for any changes.  Keep all follow-up visits as directed by your health care provider. This is important.  Contact a health care provider if:  Your lymph nodes are still swollen after 2 weeks.  Your swelling increases or spreads to other areas.  Your lymph nodes are hard, seem fixed to the skin, or are growing rapidly.  Your skin over the lymph nodes is red and inflamed.  You have a fever.  You have  chills.  You have fatigue.  You develop a sore throat.  You have abdominal pain.  You have weight loss.  You have night sweats. Get help right away if:  You notice fluid leaking from the area of the enlarged lymph node.  You have severe pain in any area of your body.  You have chest pain.  You have shortness of breath. This information is not intended to replace advice given to you by your health care provider. Make sure you discuss any questions you have with your health care provider. Document Released: 10/07/2007 Document Revised: 06/05/2015 Document Reviewed: 08/02/2013 Elsevier Interactive Patient Education  2018 [employer].     IF you received an x-ray today, you will receive an invoice from [facility]. Please contact [facility] at [phone] with questions or concerns regarding your invoice.   IF you received labwork today, you will receive an invoice from [facility]. Please contact [facility] at [phone] with questions or concerns regarding your invoice.   Our billing staff will not be able to assist you with questions regarding bills from these companies.  You will be contacted with the lab results as soon as they are available. The fastest way to get your results is to activate your My Chart account. Instructions are located on the last page of this paperwork. If you have not heard from [place] regarding the results in 2 weeks, please contact this office.

## 2017-06-02 LAB — COMPREHENSIVE METABOLIC PANEL
A/G RATIO: 1.8 (ref 1.2–2.2)
ALBUMIN: 4.6 g/dL (ref 3.5–5.5)
ALT: 45 IU/L — AB (ref 0–32)
AST: 30 IU/L (ref 0–40)
Alkaline Phosphatase: 46 IU/L (ref 39–117)
BUN/Creatinine Ratio: 17 (ref 9–23)
BUN: 10 mg/dL (ref 6–20)
Bilirubin Total: 0.3 mg/dL (ref 0.0–1.2)
CALCIUM: 9.4 mg/dL (ref 8.7–10.2)
CO2: 24 mmol/L (ref 20–29)
Chloride: 102 mmol/L (ref 96–106)
Creatinine, Ser: 0.6 mg/dL (ref 0.57–1.00)
GFR, EST AFRICAN AMERICAN: 137 mL/min/{1.73_m2} (ref >59)
GFR, EST NON AFRICAN AMERICAN: 118 mL/min/{1.73_m2} (ref >59)
GLUCOSE: 99 mg/dL (ref 65–99)
Globulin, Total: 2.5 g/dL (ref 1.5–4.5)
POTASSIUM: 4.2 mmol/L (ref 3.5–5.2)
Sodium: 140 mmol/L (ref 134–144)
TOTAL PROTEIN: 7.1 g/dL (ref 6.0–8.5)

## 2017-06-02 LAB — EPSTEIN-BARR VIRUS VCA ANTIBODY PANEL
EBV NA IgG: >600 U/mL — ABNORMAL HIGH (ref 0.0–17.9)
EBV VCA IgG: >600 U/mL — ABNORMAL HIGH (ref 0.0–17.9)

## 2017-06-02 LAB — THYROID PANEL WITH TSH
Free Thyroxine Index: 1.8 (ref 1.2–4.9)
T3 UPTAKE RATIO: 23 % — AB (ref 24–39)
T4, Total: 7.7 ug/dL (ref 4.5–12.0)
TSH: 0.81 u[IU]/mL (ref 0.450–4.500)

## 2017-06-02 LAB — CBC
HEMOGLOBIN: 13.5 g/dL (ref 11.1–15.9)
Hematocrit: 41 % (ref 34.0–46.6)
MCH: 30.4 pg (ref 26.6–33.0)
MCHC: 32.9 g/dL (ref 31.5–35.7)
MCV: 92 fL (ref 79–97)
Platelets: 221 10*3/uL (ref 150–450)
RBC: 4.44 x10E6/uL (ref 3.77–5.28)
RDW: 13.3 % (ref 12.3–15.4)
WBC: 8.1 10*3/uL (ref 3.4–10.8)

## 2017-06-02 LAB — SEDIMENTATION RATE: Sed Rate: 2 mm/hr (ref 0–32)

## 2017-06-07 NOTE — Addendum Note (Signed)
Addended by: Isaac Bliss on: 06/07/2017 01:45 PM   Modules accepted: Orders

## 2017-06-08 LAB — HEPATITIS B SURFACE ANTIGEN: Hepatitis B Surface Ag: NEGATIVE

## 2017-06-08 LAB — HEPATITIS C ANTIBODY

## 2019-04-05 ENCOUNTER — Ambulatory Visit: Payer: Medicaid Other | Attending: Internal Medicine

## 2019-04-05 DIAGNOSIS — Z23 Encounter for immunization: Secondary | ICD-10-CM

## 2019-04-05 NOTE — Progress Notes (Signed)
   Covid-19 Vaccination Clinic  Name:  Jamie Cantrell    MRN: [id number] DOB: [birth year]  04/05/2019  Ms. [name] was observed post Covid-19 immunization for 15 minutes without incident. She was provided with Vaccine Information Sheet and instruction to access the V-Safe system.   Ms. [name] was instructed to call 911 with any severe reactions post vaccine: Marland Kitchen Difficulty breathing  . Swelling of face and throat  . A fast heartbeat  . A bad rash all over body  . Dizziness and weakness   Immunizations Administered    Name Date Dose VIS Date Route   Pfizer COVID-19 Vaccine 04/05/2019 10:42 AM 0.3 mL 12/22/2018 Intramuscular   Manufacturer: [employer], [employer]   Lot: [id number]   NDC: [address]-3

## 2019-05-01 ENCOUNTER — Ambulatory Visit: Payer: Medicaid Other | Attending: Internal Medicine

## 2019-05-01 DIAGNOSIS — Z23 Encounter for immunization: Secondary | ICD-10-CM

## 2019-05-01 NOTE — Progress Notes (Signed)
   Covid-19 Vaccination Clinic  Name:  [name]    MRN: [id number] DOB: 1980-04-29  05/01/2019  [name] was observed post Covid-19 immunization for 15 minutes without incident. She was provided with Vaccine Information Sheet and instruction to access the V-Safe system.   [name] was instructed to call 911 with any severe reactions post vaccine: Marland Kitchen Difficulty breathing  . Swelling of face and throat  . A fast heartbeat  . A bad rash all over body  . Dizziness and weakness   Immunizations Administered    Name Date Dose VIS Date Route   Pfizer COVID-19 Vaccine 05/01/2019 10:47 AM 0.3 mL 03/07/2018 Intramuscular   Manufacturer: [employer], [employer]   Lot: [id number]   NDC: [address]-9

## 2020-06-16 ENCOUNTER — Ambulatory Visit (HOSPITAL_BASED_OUTPATIENT_CLINIC_OR_DEPARTMENT_OTHER): Payer: Medicaid Other | Admitting: Nurse Practitioner

## 2021-01-11 NOTE — L&D Delivery Note (Addendum)
Delivery Note At 4:30 AM a viable female was delivered via Vaginal, Spontaneous (Presentation: Right Occiput Anterior).  APGAR: 8, 9; weight pending   Placenta status: Spontaneous,intact. Pitocin bolus started. Cord: 3 vessels with the following complications: None Anesthesia: Epidural Episiotomy: None Lacerations: 1st degree Suture Repair:  none Est. Blood Loss (mL): 103  Mom to postpartum.  Baby to Couplet care / Skin to Skin.  Milderd Meager, SNM 09/18/2021, 4:46 AM  The above was performed under my direct supervision and guidance.

## 2021-03-13 ENCOUNTER — Ambulatory Visit (INDEPENDENT_AMBULATORY_CARE_PROVIDER_SITE_OTHER): Payer: Medicaid Other

## 2021-03-13 ENCOUNTER — Other Ambulatory Visit: Payer: Self-pay

## 2021-03-13 ENCOUNTER — Encounter: Payer: Self-pay | Admitting: Obstetrics

## 2021-03-13 DIAGNOSIS — Z3200 Encounter for pregnancy test, result unknown: Secondary | ICD-10-CM

## 2021-03-13 DIAGNOSIS — Z3201 Encounter for pregnancy test, result positive: Secondary | ICD-10-CM

## 2021-03-13 LAB — POCT URINE PREGNANCY: Preg Test, Ur: POSITIVE — AB

## 2021-03-13 NOTE — Progress Notes (Signed)
..  Ms. Lenhard presents today for UPT. She has no unusual complaints. ?LMP:01-04-21 ?   ?OBJECTIVE: Appears well, in no apparent distress.  ?OB History   ? ? Gravida  ?4  ? Para  ?2  ? Term  ?2  ? Preterm  ?   ? AB  ?1  ? Living  ?2  ?  ? ? SAB  ?1  ? IAB  ?   ? Ectopic  ?   ? Multiple  ?0  ? Live Births  ?2  ?   ?  ?  ? ?Home UPT Result:Positive ?In-Office UPT result:Positive ?I have reviewed the patient's medical, obstetrical, social, and family histories, and medications.  ? ?ASSESSMENT: Positive pregnancy test ? ?PLAN ?Prenatal care to be completed at: Unsure ? ? ?

## 2021-03-18 ENCOUNTER — Other Ambulatory Visit: Payer: Self-pay

## 2021-03-18 ENCOUNTER — Ambulatory Visit (INDEPENDENT_AMBULATORY_CARE_PROVIDER_SITE_OTHER): Payer: Medicaid Other

## 2021-03-18 VITALS — BP 131/87 | HR 86 | Ht 70.0 in | Wt 218.1 lb

## 2021-03-18 DIAGNOSIS — Z3481 Encounter for supervision of other normal pregnancy, first trimester: Secondary | ICD-10-CM

## 2021-03-18 DIAGNOSIS — O3680X0 Pregnancy with inconclusive fetal viability, not applicable or unspecified: Secondary | ICD-10-CM

## 2021-03-18 DIAGNOSIS — Z3490 Encounter for supervision of normal pregnancy, unspecified, unspecified trimester: Secondary | ICD-10-CM | POA: Insufficient documentation

## 2021-03-18 MED ORDER — BLOOD PRESSURE KIT DEVI: 1.0000 | 0 refills | Status: DC

## 2021-03-18 NOTE — Progress Notes (Cosign Needed)
New OB Intake ? ?I connected with  Jethro Bastos on 03/18/21 at  8:15 AM EST by in person and verified that I am speaking with the correct person using two identifiers. Nurse is located at [facility] and pt is located at [place]. ? ?I discussed the limitations, risks, security and privacy concerns of performing an evaluation and management service by telephone and the availability of in person appointments. I also discussed with the patient that there may be a patient responsible charge related to this service. The patient expressed understanding and agreed to proceed. ? ?I explained I am completing New OB Intake today. We discussed her EDD of 10/11/21 that is based on LMP of 01/04/21. Pt is G4/P2012. I reviewed her allergies, medications, Medical/Surgical/OB history, and appropriate screenings. I informed her of [facility] services. Based on history, this is a/an  pregnancy uncomplicated .  ? ?Patient Active Problem List  ? Diagnosis Date Noted  ? Post term pregnancy over 40 weeks 05/13/2015  ? NSVD (normal spontaneous vaginal delivery) 05/13/2015  ? ? ?Concerns addressed today ? ?Delivery Plans:  ?Plans to deliver at [facility].  ? ?Waterbirth candidate?  ? ?MyChart/Babyscripts ?MyChart access verified. I explained pt will have some visits in office and some virtually. Babyscripts instructions given and order placed. Patient verifies receipt of registration text/e-mail. Account successfully created and app downloaded. ? ?Blood Pressure Cuff  ?Blood pressure cuff ordered for patient to pick-up from [employer]. Explained after first prenatal appt pt will check weekly and document in 46. ? ?Weight scale: Patient does not  have weight scale. Weight scale ordered for patient to pick up from [employer].  ? ?Anatomy US ?Explained first scheduled [place] will be around 19 weeks. Dating and viability scan performed today. ? ? ?Labs ?Discussed Johnsie Cancel genetic screening with patient. Would like both Panorama and  Horizon drawn at new OB visit.Also if interested in genetic testing, tell patient she will need AFP 15-21 weeks to complete genetic testing .Routine prenatal labs needed. ? ?Covid Vaccine ?Patient has covid vaccine.  ? ? ?Informed patient of [facility] website  and placed link in her AVS.  ? ?Social Determinants of Health ?Food Insecurity: Patient denies food insecurity. ?WIC Referral: Patient is not interested in referral to [facility].  ?Transportation: Patient denies transportation needs. ?Childcare: Discussed no children allowed at ultrasound appointments. Offered childcare services; patient declines childcare services at this time. ? ?Send link to Pregnancy Navigators ? ? ?Placed OB Box on problem list and updated ? ?First visit review ?I reviewed new OB appt with pt. I explained she will have a pelvic exam, ob bloodwork with genetic screening, and PAP smear. Explained pt will be seen by Emeterio Reeve at first visit; encounter routed to appropriate provider. Explained that patient will be seen by pregnancy navigator following visit with provider. [facility] information placed in AVS.  ? ?Lucianne Lei, RN ?03/18/2021  8:12 AM  ?

## 2021-03-18 NOTE — Progress Notes (Signed)
Agree with nurses's documentation of this patient's clinic encounter.  Merrissa Giacobbe L, MD  

## 2021-03-31 NOTE — Progress Notes (Signed)
Patient was assessed and managed by nursing staff during this encounter. I have reviewed the chart and agree with the documentation and plan. I have also made any necessary editorial changes. ? ?Scheryl Darter, MD ?03/31/2021 1:24 PM  ? ?

## 2021-04-14 ENCOUNTER — Other Ambulatory Visit (HOSPITAL_COMMUNITY)
Admission: RE | Admit: 2021-04-14 | Discharge: 2021-04-14 | Disposition: A | Discharge status: Home / Self Care | Payer: Medicaid Other | Source: Ambulatory Visit | Attending: Obstetrics & Gynecology | Admitting: Obstetrics & Gynecology

## 2021-04-14 ENCOUNTER — Ambulatory Visit (INDEPENDENT_AMBULATORY_CARE_PROVIDER_SITE_OTHER): Payer: Medicaid Other | Admitting: Obstetrics & Gynecology

## 2021-04-14 ENCOUNTER — Encounter: Payer: Self-pay | Admitting: Obstetrics & Gynecology

## 2021-04-14 VITALS — BP 137/86 | HR 89 | Wt 217.7 lb

## 2021-04-14 DIAGNOSIS — O09522 Supervision of elderly multigravida, second trimester: Secondary | ICD-10-CM

## 2021-04-14 DIAGNOSIS — O09529 Supervision of elderly multigravida, unspecified trimester: Secondary | ICD-10-CM | POA: Insufficient documentation

## 2021-04-14 DIAGNOSIS — Z3A14 14 weeks gestation of pregnancy: Secondary | ICD-10-CM

## 2021-04-14 DIAGNOSIS — Z3481 Encounter for supervision of other normal pregnancy, first trimester: Secondary | ICD-10-CM

## 2021-04-14 NOTE — Progress Notes (Signed)
NOB in office. Pt c/o of dizziness abotu once of twice a day. No other complaints at this time.  ?

## 2021-04-14 NOTE — Progress Notes (Signed)
?  Subjective:occasional dizziness and headache  ? ? Jamie Cantrell is a [id number] [redacted]w[redacted]d being seen today for her first obstetrical visit.  Her obstetrical history is significant for advanced maternal age. Patient does intend to breast feed. Pregnancy history fully reviewed. ? ?Patient reports headache and dizziness . ? ?Vitals:  ? 04/14/21 1016  ?BP: 137/86  ?Pulse: 89  ?Weight: 217 lb 11.2 oz (98.7 kg)  ? ? ?HISTORY: ?OB History  ?Gravida Para Term Preterm AB Living  ?4 2 2   1 2   ?SAB IAB Ectopic Multiple Live Births  ?1     0 2  ?  ?# Outcome Date GA Lbr Len/2nd Weight Sex Delivery Anes PTL Lv  ?4 Current           ?3 Term 05/13/15 [redacted]w[redacted]d 07:44 / 00:13 7 lb 4 oz (3.289 kg) M Vag-Spont EPI  LIV  ?2 Term 2012 [redacted]w[redacted]d  6 lb 5 oz (2.863 kg)  Vag-Spont EPI  LIV  ?1 SAB           ? ?Past Medical History:  ?Diagnosis Date  ? Hx of chlamydia infection   ? Kidney stone   ? ?History reviewed. No pertinent surgical history. ?Family History  ?Problem Relation Age of Onset  ? Hypertension Father   ? Alzheimer's disease Maternal Grandmother   ? Heart attack Maternal Grandfather   ? Ovarian cancer Paternal Grandmother   ? ? ? ?Exam  ? ? ?Uterus:   15 week  ?Pelvic Exam:   ? Perineum: No Hemorrhoids  ? Vulva: normal  ? Vagina:  normal mucosa  ? pH:   ? Cervix: no lesions  ? Adnexa: normal adnexa  ? Bony Pelvis: average  ?System: Breast:  normal appearance, no masses or tenderness  ? Skin: normal coloration and turgor, no rashes ?  ? Neurologic: oriented, normal mood  ? Extremities: normal strength, tone, and muscle mass  ? HEENT PERRLA, neck supple with midline trachea, and thyroid without masses  ? Mouth/Teeth mucous membranes moist, pharynx normal without lesions and dental hygiene good  ? Neck supple and no masses  ? Cardiovascular: regular rate and rhythm, no murmurs or gallops  ? Respiratory:  appears well, vitals normal, no respiratory distress, acyanotic, normal RR, neck free of mass or lymphadenopathy, chest clear, no  wheezing, crepitations, rhonchi, normal symmetric air entry  ? Abdomen: soft, non-tender; bowel sounds normal; no masses,  no organomegaly  ? Urinary: urethral meatus normal  ? ? ?  ?Assessment:  ? ? Pregnancy: [redacted]w[redacted]d ?Patient Active Problem List  ? Diagnosis Date Noted  ? Advanced maternal age in multigravida 04/14/2021  ? Supervision of normal pregnancy 03/18/2021  ? ?  ? ?  ?Plan:  ? ?  ?Initial labs drawn. ?Prenatal vitamins. ?Problem list reviewed and updated. ?Genetic Screening discussed : ordered. ? Ultrasound discussed; fetal survey: ordered. ? Follow up in 4 weeks. ?50% of 30 min visit spent on counseling and coordination of care.  ?TSH and A1c added  ? ?05/18/2021 ?04/14/2021 ? ? ?

## 2021-04-15 LAB — CERVICOVAGINAL ANCILLARY ONLY
Chlamydia: NEGATIVE
Comment: NEGATIVE
Comment: NORMAL
Neisseria Gonorrhea: NEGATIVE

## 2021-04-16 LAB — CYTOLOGY - PAP
Comment: NEGATIVE
Diagnosis: NEGATIVE
Diagnosis: REACTIVE
High risk HPV: NEGATIVE

## 2021-04-16 LAB — SPECIMEN STATUS REPORT

## 2021-04-16 LAB — CULTURE, OB URINE

## 2021-04-16 LAB — TSH: TSH: 0.778 u[IU]/mL (ref 0.450–4.500)

## 2021-04-16 LAB — URINE CULTURE, OB REFLEX: Organism ID, Bacteria: NO GROWTH

## 2021-04-20 ENCOUNTER — Encounter: Payer: Self-pay | Admitting: Obstetrics & Gynecology

## 2021-04-22 ENCOUNTER — Encounter: Payer: Self-pay | Admitting: Obstetrics & Gynecology

## 2021-04-22 LAB — HEMOGLOBIN A1C
Est. average glucose Bld gHb Est-mCnc: 97 mg/dL
Hgb A1c MFr Bld: 5 % (ref 4.8–5.6)

## 2021-04-22 LAB — CBC/D/PLT+RPR+RH+ABO+RUBIGG...
Antibody Screen: NEGATIVE
Basophils Absolute: 0 10*3/uL (ref 0.0–0.2)
Basos: 0 %
EOS (ABSOLUTE): 0.1 10*3/uL (ref 0.0–0.4)
Eos: 1 %
HCV Ab: NONREACTIVE
HIV Screen 4th Generation wRfx: NONREACTIVE
Hematocrit: 38.7 % (ref 34.0–46.6)
Hemoglobin: 13.1 g/dL (ref 11.1–15.9)
Hepatitis B Surface Ag: NEGATIVE
Immature Grans (Abs): 0 10*3/uL (ref 0.0–0.1)
Immature Granulocytes: 1 %
Lymphocytes Absolute: 1.4 10*3/uL (ref 0.7–3.1)
Lymphs: 21 %
MCH: 29.7 pg (ref 26.6–33.0)
MCHC: 33.9 g/dL (ref 31.5–35.7)
MCV: 88 fL (ref 79–97)
Monocytes Absolute: 0.3 10*3/uL (ref 0.1–0.9)
Monocytes: 5 %
Neutrophils Absolute: 4.7 10*3/uL (ref 1.4–7.0)
Neutrophils: 72 %
Platelets: 160 10*3/uL (ref 150–450)
RBC: 4.41 x10E6/uL (ref 3.77–5.28)
RDW: 11.9 % (ref 11.7–15.4)
RPR Ser Ql: NONREACTIVE
Rh Factor: POSITIVE
Rubella Antibodies, IGG: 5.31 index (ref >0.99)
WBC: 6.6 10*3/uL (ref 3.4–10.8)

## 2021-04-22 LAB — AFP, SERUM, OPEN SPINA BIFIDA
AFP Value: 10 ng/mL
Gest. Age on Collection Date: 14.2 weeks
Maternal Age At EDD: 39.7 yr
Weight: 217 [lb_av]

## 2021-04-22 LAB — HCV INTERPRETATION

## 2021-04-23 ENCOUNTER — Encounter: Payer: Self-pay | Admitting: Obstetrics & Gynecology

## 2021-05-12 ENCOUNTER — Ambulatory Visit (INDEPENDENT_AMBULATORY_CARE_PROVIDER_SITE_OTHER): Payer: Medicaid Other | Admitting: Obstetrics and Gynecology

## 2021-05-12 VITALS — BP 115/77 | HR 87 | Wt 217.6 lb

## 2021-05-12 DIAGNOSIS — Z3481 Encounter for supervision of other normal pregnancy, first trimester: Secondary | ICD-10-CM

## 2021-05-12 DIAGNOSIS — O09522 Supervision of elderly multigravida, second trimester: Secondary | ICD-10-CM

## 2021-05-12 DIAGNOSIS — Z3A18 18 weeks gestation of pregnancy: Secondary | ICD-10-CM

## 2021-05-12 NOTE — Progress Notes (Signed)
? ?  PRENATAL VISIT NOTE ? ?Subjective:  ?Jamie Cantrell is a 41 y.o. [phone] at [redacted]w[redacted]d being seen today for ongoing prenatal care.  She is currently monitored for the following issues for this low-risk pregnancy and has Supervision of normal pregnancy and Advanced maternal age in multigravida on their problem list. ? ?Patient doing well with no acute concerns today. She reports  occasional headaches .  Contractions: Not present. Vag. Bleeding: None.  Movement: Present. Denies leaking of fluid.  ? ?The following portions of the patient's history were reviewed and updated as appropriate: allergies, current medications, past family history, past medical history, past social history, past surgical history and problem list. Problem list updated. ? ?Objective:  ? ?Vitals:  ? 05/12/21 0839  ?BP: 115/77  ?Pulse: 87  ?Weight: 98.7 kg  ? ? ?Fetal Status: Fetal Heart Rate (bpm): 144 Fundal Height: 19 cm Movement: Present    ? ?General:  Alert, oriented and cooperative. Patient is in no acute distress.  ?Skin: Skin is warm and dry. No rash noted.   ?Cardiovascular: Normal heart rate noted  ?Respiratory: Normal respiratory effort, no problems with respiration noted  ?Abdomen: Soft, gravid, appropriate for gestational age.  Pain/Pressure: Absent     ?Pelvic: Cervical exam deferred        ?Extremities: Normal range of motion.  Edema: None  ?Mental Status:  Normal mood and affect. Normal behavior. Normal judgment and thought content.  ? ?Assessment and Plan:  ?Pregnancy: [id number] at [redacted]w[redacted]d ? ?1. Encounter for supervision of other normal pregnancy in first trimester ?Continue routine care ?EDC adjusted due to LMP not being verified ?AFP redraw due to previously too early ?- AFP, Serum, Open Spina Bifida ? ?2. [redacted] weeks gestation of pregnancy ? ? ?3. Multigravida of advanced maternal age in second trimester ?Pt has anatomy scan in 1 week ? ?Preterm labor symptoms and general obstetric precautions including but not limited to vaginal  bleeding, contractions, leaking of fluid and fetal movement were reviewed in detail with the patient. ? ?Please refer to After Visit Summary for other counseling recommendations.  ? ?Return in about 4 weeks (around 06/09/2021) for ROB, in person. ? ? ?Mariel Aloe, MD ?Faculty Attending ?[facility] ?  ?

## 2021-05-12 NOTE — Progress Notes (Signed)
Pt reports fetal movement, denies pain.  

## 2021-05-14 LAB — AFP, SERUM, OPEN SPINA BIFIDA
AFP MoM: 0.62
AFP Value: 26.2 ng/mL
Gest. Age on Collection Date: 19.2 weeks
Maternal Age At EDD: 39.7 yr
OSBR Risk 1 IN: 10000
Test Results:: NEGATIVE
Weight: 216 [lb_av]

## 2021-05-19 ENCOUNTER — Encounter: Payer: Self-pay | Admitting: *Deleted

## 2021-05-19 ENCOUNTER — Ambulatory Visit: Payer: Medicaid Other | Attending: Obstetrics & Gynecology

## 2021-05-19 ENCOUNTER — Other Ambulatory Visit: Payer: Self-pay | Admitting: *Deleted

## 2021-05-19 ENCOUNTER — Ambulatory Visit: Payer: Medicaid Other | Admitting: *Deleted

## 2021-05-19 VITALS — BP 122/53 | HR 68

## 2021-05-19 DIAGNOSIS — O09522 Supervision of elderly multigravida, second trimester: Secondary | ICD-10-CM

## 2021-05-19 DIAGNOSIS — Z363 Encounter for antenatal screening for malformations: Secondary | ICD-10-CM | POA: Insufficient documentation

## 2021-05-19 DIAGNOSIS — Z3A20 20 weeks gestation of pregnancy: Secondary | ICD-10-CM | POA: Diagnosis not present

## 2021-05-19 DIAGNOSIS — O99212 Obesity complicating pregnancy, second trimester: Secondary | ICD-10-CM

## 2021-05-19 DIAGNOSIS — Z3481 Encounter for supervision of other normal pregnancy, first trimester: Secondary | ICD-10-CM | POA: Diagnosis not present

## 2021-06-09 ENCOUNTER — Encounter: Payer: Self-pay | Admitting: Obstetrics

## 2021-06-09 ENCOUNTER — Ambulatory Visit (INDEPENDENT_AMBULATORY_CARE_PROVIDER_SITE_OTHER): Payer: Medicaid Other | Admitting: Obstetrics

## 2021-06-09 VITALS — BP 108/71 | HR 77 | Wt 215.3 lb

## 2021-06-09 DIAGNOSIS — O0992 Supervision of high risk pregnancy, unspecified, second trimester: Secondary | ICD-10-CM

## 2021-06-09 DIAGNOSIS — Z3A23 23 weeks gestation of pregnancy: Secondary | ICD-10-CM

## 2021-06-09 DIAGNOSIS — O09522 Supervision of elderly multigravida, second trimester: Secondary | ICD-10-CM

## 2021-06-09 DIAGNOSIS — O0990 Supervision of high risk pregnancy, unspecified, unspecified trimester: Secondary | ICD-10-CM

## 2021-06-09 NOTE — Progress Notes (Signed)
Subjective:  Jamie Cantrell is a 41 y.o. [phone] at [redacted]w[redacted]d being seen today for ongoing prenatal care.  She is currently monitored for the following issues for this high-risk pregnancy and has Supervision of normal pregnancy and Advanced maternal age in multigravida on their problem list.  Patient reports no complaints.  Contractions: Not present. Vag. Bleeding: None.  Movement: Present. Denies leaking of fluid.   The following portions of the patient's history were reviewed and updated as appropriate: allergies, current medications, past family history, past medical history, past social history, past surgical history and problem list. Problem list updated.  Objective:   Vitals:   06/09/21 0908  BP: 108/71  Pulse: 77  Weight: 215 lb 4.8 oz (97.7 kg)    Fetal Status: Fetal Heart Rate (bpm): 135   Movement: Present     General:  Alert, oriented and cooperative. Patient is in no acute distress.  Skin: Skin is warm and dry. No rash noted.   Cardiovascular: Normal heart rate noted  Respiratory: Normal respiratory effort, no problems with respiration noted  Abdomen: Soft, gravid, appropriate for gestational age. Pain/Pressure: Absent     Pelvic:  Cervical exam deferred        Extremities: Normal range of motion.  Edema: None  Mental Status: Normal mood and affect. Normal behavior. Normal judgment and thought content.   Urinalysis:      Assessment and Plan:  Pregnancy: [id number] at [redacted]w[redacted]d  1. Supervision of high risk pregnancy, antepartum  2. Multigravida of advanced maternal age in second trimester   Preterm labor symptoms and general obstetric precautions including but not limited to vaginal bleeding, contractions, leaking of fluid and fetal movement were reviewed in detail with the patient. Please refer to After Visit Summary for other counseling recommendations.   Return in about 4 weeks (around 07/07/2021) for ROB, 2 hour OGTT.   Shelly Bombard, MD  06/09/21

## 2021-06-09 NOTE — Progress Notes (Signed)
ROB 23.2wks No concerns

## 2021-06-19 ENCOUNTER — Ambulatory Visit: Payer: Medicaid Other | Admitting: *Deleted

## 2021-06-19 ENCOUNTER — Ambulatory Visit: Payer: Medicaid Other | Attending: Maternal & Fetal Medicine

## 2021-06-19 ENCOUNTER — Other Ambulatory Visit: Payer: Self-pay | Admitting: Maternal & Fetal Medicine

## 2021-06-19 ENCOUNTER — Encounter: Payer: Self-pay | Admitting: *Deleted

## 2021-06-19 ENCOUNTER — Other Ambulatory Visit: Payer: Self-pay | Admitting: *Deleted

## 2021-06-19 VITALS — BP 111/54 | HR 66

## 2021-06-19 DIAGNOSIS — O99212 Obesity complicating pregnancy, second trimester: Secondary | ICD-10-CM

## 2021-06-19 DIAGNOSIS — Z362 Encounter for other antenatal screening follow-up: Secondary | ICD-10-CM | POA: Diagnosis present

## 2021-06-19 DIAGNOSIS — O365920 Maternal care for other known or suspected poor fetal growth, second trimester, not applicable or unspecified: Secondary | ICD-10-CM

## 2021-06-19 DIAGNOSIS — O09522 Supervision of elderly multigravida, second trimester: Secondary | ICD-10-CM

## 2021-06-19 DIAGNOSIS — Z3A24 24 weeks gestation of pregnancy: Secondary | ICD-10-CM

## 2021-06-19 DIAGNOSIS — O283 Abnormal ultrasonic finding on antenatal screening of mother: Secondary | ICD-10-CM

## 2021-07-07 ENCOUNTER — Encounter: Payer: Self-pay | Admitting: Obstetrics and Gynecology

## 2021-07-07 ENCOUNTER — Other Ambulatory Visit: Payer: Medicaid Other

## 2021-07-07 ENCOUNTER — Ambulatory Visit (INDEPENDENT_AMBULATORY_CARE_PROVIDER_SITE_OTHER): Payer: Medicaid Other | Admitting: Obstetrics and Gynecology

## 2021-07-07 VITALS — BP 115/77 | HR 83 | Wt 213.0 lb

## 2021-07-07 DIAGNOSIS — Z3483 Encounter for supervision of other normal pregnancy, third trimester: Secondary | ICD-10-CM

## 2021-07-07 DIAGNOSIS — O09523 Supervision of elderly multigravida, third trimester: Secondary | ICD-10-CM

## 2021-07-08 LAB — CBC
Hematocrit: 34.5 % (ref 34.0–46.6)
Hemoglobin: 11.9 g/dL (ref 11.1–15.9)
MCH: 29.7 pg (ref 26.6–33.0)
MCHC: 34.5 g/dL (ref 31.5–35.7)
MCV: 86 fL (ref 79–97)
Platelets: 154 10*3/uL (ref 150–450)
RBC: 4.01 x10E6/uL (ref 3.77–5.28)
RDW: 13.9 % (ref 11.7–15.4)
WBC: 8.7 10*3/uL (ref 3.4–10.8)

## 2021-07-08 LAB — GLUCOSE TOLERANCE, 2 HOURS W/ 1HR
Glucose, 1 hour: 123 mg/dL (ref 70–179)
Glucose, 2 hour: 88 mg/dL (ref 70–152)
Glucose, Fasting: 86 mg/dL (ref 70–91)

## 2021-07-08 LAB — HIV ANTIBODY (ROUTINE TESTING W REFLEX): HIV Screen 4th Generation wRfx: NONREACTIVE

## 2021-07-08 LAB — RPR: RPR Ser Ql: NONREACTIVE

## 2021-07-10 ENCOUNTER — Ambulatory Visit: Payer: Medicaid Other | Admitting: *Deleted

## 2021-07-10 ENCOUNTER — Ambulatory Visit: Payer: Medicaid Other | Attending: Obstetrics and Gynecology

## 2021-07-10 ENCOUNTER — Other Ambulatory Visit: Payer: Self-pay | Admitting: *Deleted

## 2021-07-10 VITALS — BP 115/55 | HR 70

## 2021-07-10 DIAGNOSIS — O365920 Maternal care for other known or suspected poor fetal growth, second trimester, not applicable or unspecified: Secondary | ICD-10-CM | POA: Insufficient documentation

## 2021-07-10 DIAGNOSIS — O283 Abnormal ultrasonic finding on antenatal screening of mother: Secondary | ICD-10-CM | POA: Insufficient documentation

## 2021-07-10 DIAGNOSIS — Z3689 Encounter for other specified antenatal screening: Secondary | ICD-10-CM

## 2021-07-10 DIAGNOSIS — O365990 Maternal care for other known or suspected poor fetal growth, unspecified trimester, not applicable or unspecified: Secondary | ICD-10-CM

## 2021-07-10 DIAGNOSIS — O09522 Supervision of elderly multigravida, second trimester: Secondary | ICD-10-CM | POA: Insufficient documentation

## 2021-07-10 DIAGNOSIS — Z3A27 27 weeks gestation of pregnancy: Secondary | ICD-10-CM

## 2021-07-10 DIAGNOSIS — O3662X0 Maternal care for excessive fetal growth, second trimester, not applicable or unspecified: Secondary | ICD-10-CM

## 2021-07-16 ENCOUNTER — Telehealth: Payer: Self-pay

## 2021-07-16 NOTE — Telephone Encounter (Signed)
Breast pump order received from [employer].  Ordered signed and faxed back. Confirmation received. 

## 2021-07-23 ENCOUNTER — Encounter: Payer: Self-pay | Admitting: Obstetrics & Gynecology

## 2021-07-23 ENCOUNTER — Ambulatory Visit (INDEPENDENT_AMBULATORY_CARE_PROVIDER_SITE_OTHER): Payer: Medicaid Other | Admitting: Obstetrics & Gynecology

## 2021-07-23 VITALS — BP 117/74 | HR 82 | Wt 216.3 lb

## 2021-07-23 DIAGNOSIS — Z23 Encounter for immunization: Secondary | ICD-10-CM

## 2021-07-23 DIAGNOSIS — Z3A29 29 weeks gestation of pregnancy: Secondary | ICD-10-CM

## 2021-07-23 DIAGNOSIS — O09523 Supervision of elderly multigravida, third trimester: Secondary | ICD-10-CM

## 2021-07-23 DIAGNOSIS — Z3483 Encounter for supervision of other normal pregnancy, third trimester: Secondary | ICD-10-CM

## 2021-07-23 NOTE — Progress Notes (Signed)
   PRENATAL VISIT NOTE  Subjective:  Jamie Cantrell is a 41 y.o. [phone] at [redacted]w[redacted]d being seen today for ongoing prenatal care.  She is currently monitored for the following issues for this low-risk pregnancy and has Supervision of normal pregnancy and Advanced maternal age in multigravida on their problem list.  Patient reports no complaints.  Contractions: Not present. Vag. Bleeding: None.  Movement: Present. Denies leaking of fluid.   The following portions of the patient's history were reviewed and updated as appropriate: allergies, current medications, past family history, past medical history, past social history, past surgical history and problem list.   Objective:   Vitals:   07/23/21 0850  BP: 117/74  Pulse: 82  Weight: 216 lb 4.8 oz (98.1 kg)    Fetal Status: Fetal Heart Rate (bpm): 141 Fundal Height: 29 cm Movement: Present     General:  Alert, oriented and cooperative. Patient is in no acute distress.  Skin: Skin is warm and dry. No rash noted.   Cardiovascular: Normal heart rate noted  Respiratory: Normal respiratory effort, no problems with respiration noted  Abdomen: Soft, gravid, appropriate for gestational age.  Pain/Pressure: Absent     Pelvic: Cervical exam deferred        Extremities: Normal range of motion.  Edema: None  Mental Status: Normal mood and affect. Normal behavior. Normal judgment and thought content.   Assessment and Plan:  Pregnancy: [id number] at [redacted]w[redacted]d 1. Need for Tdap vaccination - Tdap vaccine greater than or equal to 7yo IM given today  2. Multigravida of advanced maternal age in third trimester 3. [redacted] weeks gestation of pregnancy 4. Encounter for supervision of other normal pregnancy in third trimester Normal third trimester labs last week. BTL papers signed 07/08/21.  Preterm labor symptoms and general obstetric precautions including but not limited to vaginal bleeding, contractions, leaking of fluid and fetal movement were reviewed in detail  with the patient. Please refer to After Visit Summary for other counseling recommendations.   No follow-ups on file.  Future Appointments  Date Time Provider Department Center  08/05/2021  9:35 AM Corlis Hove, NP [facility] None  08/07/2021  7:15 AM [facility] NURSE [facility]  08/07/2021  7:30 AM [facility] US2 [facility]  08/17/2021  9:55 AM Leftwich-Kirby, Wilmer Floor, CNM [facility] None  08/31/2021  9:35 AM Constant, Gigi Gin, MD [facility] None  09/16/2021  9:35 AM Danyael Alipio, Jethro Bastos, MD [facility] None  09/22/2021  9:35 AM Leftwich-Kirby, Wilmer Floor, CNM [facility] None  09/30/2021  9:35 AM Corlis Hove, NP [facility] None    Jaynie Collins, MD

## 2021-08-05 ENCOUNTER — Telehealth (INDEPENDENT_AMBULATORY_CARE_PROVIDER_SITE_OTHER): Payer: Medicaid Other | Admitting: Student

## 2021-08-05 VITALS — BP 115/71 | HR 83

## 2021-08-05 DIAGNOSIS — Z3009 Encounter for other general counseling and advice on contraception: Secondary | ICD-10-CM

## 2021-08-05 DIAGNOSIS — Z3483 Encounter for supervision of other normal pregnancy, third trimester: Secondary | ICD-10-CM

## 2021-08-05 DIAGNOSIS — O09522 Supervision of elderly multigravida, second trimester: Secondary | ICD-10-CM

## 2021-08-05 DIAGNOSIS — Z3A31 31 weeks gestation of pregnancy: Secondary | ICD-10-CM

## 2021-08-05 NOTE — Progress Notes (Signed)
OBSTETRICS PRENATAL VIRTUAL VISIT ENCOUNTER NOTE  Provider location: [facility]   Patient location: Home  I connected with Jamie Cantrell on 08/05/21 at  9:35 AM EDT by MyChart Video Encounter and verified that I am speaking with the correct person using two identifiers. I discussed the limitations, risks, security and privacy concerns of performing an evaluation and management service virtually and the availability of in person appointments. I also discussed with the patient that there may be a patient responsible charge related to this service. The patient expressed understanding and agreed to proceed. Subjective:  Jamie Cantrell is a 41 y.o. [phone] at [redacted]w[redacted]d being seen today for ongoing prenatal care.  She is currently monitored for the following issues for this low-risk pregnancy and has Supervision of normal pregnancy and Advanced maternal age in multigravida on their problem list.  Patient reports no complaints.  Contractions: Not present. Vag. Bleeding: None.  Movement: Present. Denies any leaking of fluid.   The following portions of the patient's history were reviewed and updated as appropriate: allergies, current medications, past family history, past medical history, past social history, past surgical history and problem list.   Objective:   Vitals:   08/05/21 0916  BP: 115/71  Pulse: 83    Fetal Status: Fetal Heart Rate (bpm): Virtual visit   Movement: Present     General:  Alert, oriented and cooperative. Patient is in no acute distress.  Respiratory: Normal respiratory effort, no problems with respiration noted  Mental Status: Normal mood and affect. Normal behavior. Normal judgment and thought content.  Rest of physical exam deferred due to type of encounter  Imaging: [place] MFM OB FOLLOW UP  Result Date: 07/10/2021 ----------------------------------------------------------------------  OBSTETRICS REPORT                       (Signed  Final 07/10/2021 08:41 am) ---------------------------------------------------------------------- Patient Info  ID #:       [id number]                          D.O.B.:  [birth year] (40 yrs)  Name:       Jamie Cantrell                Visit Date: 07/10/2021 07:30 am ---------------------------------------------------------------------- Performed By  Attending:        Noralee Space MD        Ref. Address:     [address]                                                             [place], [place]  [place]  Performed By:     Rodrigo Ran BS      [facility]                                 [facility] at                                                             [place] [facility]                                                             [facility]  Referred By:      Chancy Milroy                    MD ---------------------------------------------------------------------- Orders  #  Description                           Code        Ordered By  1  [place] MFM OB FOLLOW UP                   76816.01    [facility]  2  [place] MFM UA CORD DOPPLER                76820.02    [facility] ----------------------------------------------------------------------  #  Order #                     Accession #                Episode #  1  [id number]                   [id number]                 [id number]  2  [id number]                   [id number]                 [id number] ---------------------------------------------------------------------- Indications  [redacted] weeks gestation of pregnancy                Z3A.27  Maternal care for excessive fetal growth,      O36.62X0  second trimester, fetus unspecified  Advanced maternal age multigravida 72+,        O84.522  second trimester (89 yrs)  Encounter for other antenatal screening        Z36.2  follow-up  LR NIPS/ Neg Horizon/ Neg AFP  ---------------------------------------------------------------------- Fetal Evaluation  Num Of Fetuses:         1  Fetal Heart Rate(bpm):  150  Cardiac Activity:       Observed  Presentation:           Cephalic  Placenta:  Anterior  P. Cord Insertion:      Previously Visualized  Amniotic Fluid  AFI FV:      Within normal limits                              Largest Pocket(cm)                              5.5 ---------------------------------------------------------------------- Biometry  BPD:     71.57  mm     G. Age:  28w 5d         72  %    CI:        69.06   %    70 - 86                                                          FL/HC:      18.0   %    18.8 - 20.6  HC:    275.08   mm     G. Age:  30w 0d         88  %    HC/AC:      1.25        1.05 - 1.21  AC:    219.79   mm     G. Age:  26w 3d         11  %    FL/BPD:     69.2   %    71 - 87  FL:      49.51  mm     G. Age:  26w 5d         11  %    FL/AC:      22.5   %    20 - 24  LV:        4.4  mm  Est. FW:    1011  gm      2 lb 4 oz     15  % ---------------------------------------------------------------------- OB History  Gravidity:    4         Term:   2         SAB:   1  Living:       2 ---------------------------------------------------------------------- Gestational Age  LMP:           26w 5d        Date:  01/04/21                 EDD:   10/11/21  U/S Today:     28w 0d                                        EDD:   10/02/21  Best:          27w 5d     Det. ByLoman Chroman         EDD:   10/04/21                                      (  03/18/21) ---------------------------------------------------------------------- Anatomy  Cranium:               Appears normal         LVOT:                   Appears normal  Cavum:                 Appears normal         Aortic Arch:            Appears normal  Ventricles:            Appears normal         Ductal Arch:            Appears normal  Choroid Plexus:        Previously seen        Diaphragm:               Appears normal  Cerebellum:            Previously seen        Stomach:                Appears normal, left                                                                        sided  Posterior Fossa:       Previously seen        Abdomen:                Appears normal  Nuchal Fold:           Previously seen        Abdominal Wall:         Previously seen  Face:                  Appears normal         Cord Vessels:           Previously seen                         (orbits and profile)  Lips:                  Previously seen        Kidneys:                Appear normal  Palate:                Appears normal         Bladder:                Appears normal  Thoracic:              Appears normal         Spine:                  Previously seen  Heart:                 Appears normal         Upper Extremities:      Previously seen                         (  4CH, axis, and                         situs)  RVOT:                  Appears normal         Lower Extremities:      Previously seen  Other:  Female gender, nasal bone, lenses, maxilla, mandible, falx, hands and          heels previously visualized.  VC, 3VV and 3VTV visualized.          Technically difficult due to maternal habitus and fetal position. ---------------------------------------------------------------------- Doppler - Fetal Vessels  Umbilical Artery   S/D     %tile                                              ADFV    RDFV   3.21       58                                                 No      No ---------------------------------------------------------------------- Cervix Uterus Adnexa  Cervix  Not visualized (advanced GA >24wks)  Uterus  No abnormality visualized.  Right Ovary  Not visualized.  Left Ovary  Not visualized.  Cul De Sac  No free fluid seen.  Adnexa  No abnormality visualized. ---------------------------------------------------------------------- Impression  Fetal growth restriction.  On previous ultrasound, the  estimated fetal weight was at the  7th percentile and the  abdominal circumference measurement is at the 2nd  percentile.  Blood pressure today at her office is 115/55 mmHg.  Amniotic fluid is normal and good fetal activity seen.  The  estimated fetal weight is at the 15th percentile and the  abdominal circumference measurement is at the 11th  percentile.  Umbilical artery Doppler showed normal forward  diastolic flow.  I reassured the patient of normal fetal growth assessment. ---------------------------------------------------------------------- Recommendations  -An appointment was made for her to return in 4 weeks for  fetal growth assessment. ----------------------------------------------------------------------                 Noralee Space, MD Electronically Signed Final Report   07/10/2021 08:41 am ----------------------------------------------------------------------  [place] MFM UA CORD DOPPLER  Result Date: 07/10/2021 ----------------------------------------------------------------------  OBSTETRICS REPORT                       (Signed Final 07/10/2021 08:41 am) ---------------------------------------------------------------------- Patient Info  ID #:       [id number]                          D.O.B.:  [birth year] (40 yrs)  Name:       Jamie Cantrell                Visit Date: 07/10/2021 07:30 am ---------------------------------------------------------------------- Performed By  Attending:        Noralee Space MD        Ref. Address:     [address]  [address]                                                             [place], [place]                                                             [address]  Performed By:     Eden Lathe BS      [facility]T                                 [facility] at                                                             [facility]                                                             [facility]   Referred By:      Hermina Staggers                    MD ---------------------------------------------------------------------- Orders  #  Description                           Code        Ordered By  1  [place] MFM OB FOLLOW UP                   77414.23    [facility]  2  [place] MFM UA CORD DOPPLER                95320.23    [facility] ----------------------------------------------------------------------  #  Order #                     Accession #                Episode #  1  [id number]                   [id number]                 [id number]  2  [id number]                   [id number]                 [id number] ---------------------------------------------------------------------- Indications  [redacted] weeks gestation of pregnancy  Z3A.27  Maternal care for excessive fetal growth,      O36.62X0  second trimester, fetus unspecified  Advanced maternal age multigravida 70+,        O43.522  second trimester (59 yrs)  Encounter for other antenatal screening        Z36.2  follow-up  LR NIPS/ Neg Horizon/ Neg AFP ---------------------------------------------------------------------- Fetal Evaluation  Num Of Fetuses:         1  Fetal Heart Rate(bpm):  150  Cardiac Activity:       Observed  Presentation:           Cephalic  Placenta:               Anterior  P. Cord Insertion:      Previously Visualized  Amniotic Fluid  AFI FV:      Within normal limits                              Largest Pocket(cm)                              5.5 ---------------------------------------------------------------------- Biometry  BPD:     71.57  mm     G. Age:  28w 5d         72  %    CI:        69.06   %    70 - 86                                                          FL/HC:      18.0   %    18.8 - 20.6  HC:    275.08   mm     G. Age:  30w 0d         88  %    HC/AC:      1.25        1.05 - 1.21  AC:    219.79   mm     G. Age:  26w 3d         11  %    FL/BPD:     69.2   %    71 - 87  FL:      49.51  mm     G. Age:  26w 5d         11  %     FL/AC:      22.5   %    20 - 24  LV:        4.4  mm  Est. FW:    1011  gm      2 lb 4 oz     15  % ---------------------------------------------------------------------- OB History  Gravidity:    4         Term:   2         SAB:   1  Living:       2 ---------------------------------------------------------------------- Gestational Age  LMP:           26w 5d        Date:  01/04/21  EDD:   10/11/21  U/S Today:     28w 0d                                        EDD:   10/02/21  Best:          27w 5d     Det. ByLoman Chroman         EDD:   10/04/21                                      (03/18/21) ---------------------------------------------------------------------- Anatomy  Cranium:               Appears normal         LVOT:                   Appears normal  Cavum:                 Appears normal         Aortic Arch:            Appears normal  Ventricles:            Appears normal         Ductal Arch:            Appears normal  Choroid Plexus:        Previously seen        Diaphragm:              Appears normal  Cerebellum:            Previously seen        Stomach:                Appears normal, left                                                                        sided  Posterior Fossa:       Previously seen        Abdomen:                Appears normal  Nuchal Fold:           Previously seen        Abdominal Wall:         Previously seen  Face:                  Appears normal         Cord Vessels:           Previously seen                         (orbits and profile)  Lips:                  Previously seen        Kidneys:                Appear normal  Palate:  Appears normal         Bladder:                Appears normal  Thoracic:              Appears normal         Spine:                  Previously seen  Heart:                 Appears normal         Upper Extremities:      Previously seen                         (4CH, axis, and                         situs)  RVOT:                   Appears normal         Lower Extremities:      Previously seen  Other:  Female gender, nasal bone, lenses, maxilla, mandible, falx, hands and          heels previously visualized.  VC, 3VV and 3VTV visualized.          Technically difficult due to maternal habitus and fetal position. ---------------------------------------------------------------------- Doppler - Fetal Vessels  Umbilical Artery   S/D     %tile                                              ADFV    RDFV   3.21       58                                                 No      No ---------------------------------------------------------------------- Cervix Uterus Adnexa  Cervix  Not visualized (advanced GA >24wks)  Uterus  No abnormality visualized.  Right Ovary  Not visualized.  Left Ovary  Not visualized.  Cul De Sac  No free fluid seen.  Adnexa  No abnormality visualized. ---------------------------------------------------------------------- Impression  Fetal growth restriction.  On previous ultrasound, the  estimated fetal weight was at the 7th percentile and the  abdominal circumference measurement is at the 2nd  percentile.  Blood pressure today at her office is 115/55 mmHg.  Amniotic fluid is normal and good fetal activity seen.  The  estimated fetal weight is at the 15th percentile and the  abdominal circumference measurement is at the 11th  percentile.  Umbilical artery Doppler showed normal forward  diastolic flow.  I reassured the patient of normal fetal growth assessment. ---------------------------------------------------------------------- Recommendations  -An appointment was made for her to return in 4 weeks for  fetal growth assessment. ----------------------------------------------------------------------                 Tama High, MD Electronically Signed Final Report   07/10/2021 08:41 am ----------------------------------------------------------------------   Assessment and Plan:  Pregnancy: [id number] at [redacted]w[redacted]d 1. Encounter for  supervision of other normal pregnancy in third trimester - Doing well, no complaints, frequent fetal movement -  Plan for follow-up in 2 weeks  2. Multigravida of advanced maternal age in second trimester - growth [place] scheduled for this Friday, 08/07/21  3. Unwanted fertility - Consent signed on 06/28  4. [redacted] weeks gestation of pregnancy  Preterm labor symptoms and general obstetric precautions including but not limited to vaginal bleeding, contractions, leaking of fluid and fetal movement were reviewed in detail with the patient. I discussed the assessment and treatment plan with the patient. The patient was provided an opportunity to ask questions and all were answered. The patient agreed with the plan and demonstrated an understanding of the instructions. The patient was advised to call back or seek an in-person office evaluation/go to [facility] at [facility] for any urgent or concerning symptoms. Please refer to After Visit Summary for other counseling recommendations.   I provided 10 minutes of face-to-face time during this encounter.  No follow-ups on file.  Future Appointments  Date Time Provider [place]  08/07/2021  7:15 AM [facility] NURSE [facility]  08/07/2021  7:30 AM [facility] US2 [facility]  08/17/2021  9:55 AM Leftwich-Kirby, Kathie Dike, CNM [facility] None  08/31/2021  9:35 AM Constant, Vickii Chafe, MD [facility] None  09/16/2021  9:35 AM Anyanwu, Sallyanne Havers, MD [facility] None  09/22/2021  9:35 AM Leftwich-Kirby, Kathie Dike, CNM [facility] None  09/30/2021  9:35 AM Johnston Ebbs, NP [place] None    Johnston Ebbs, NP [facility], [place]

## 2021-08-05 NOTE — Progress Notes (Signed)
Virtual visit: ROB 31.[redacted]wks GA No unusual complaints Meds and allergies reviewed VS WNL, good fetal movement, no edema, pain, pressure, UCs, or vaginal bleeding

## 2021-08-07 ENCOUNTER — Other Ambulatory Visit: Payer: Self-pay | Admitting: *Deleted

## 2021-08-07 ENCOUNTER — Ambulatory Visit: Payer: Medicaid Other | Attending: Obstetrics and Gynecology

## 2021-08-07 ENCOUNTER — Ambulatory Visit: Payer: Medicaid Other | Admitting: *Deleted

## 2021-08-07 VITALS — BP 125/65 | HR 75

## 2021-08-07 DIAGNOSIS — Z3689 Encounter for other specified antenatal screening: Secondary | ICD-10-CM

## 2021-08-07 DIAGNOSIS — Z3A31 31 weeks gestation of pregnancy: Secondary | ICD-10-CM | POA: Diagnosis not present

## 2021-08-07 DIAGNOSIS — O365990 Maternal care for other known or suspected poor fetal growth, unspecified trimester, not applicable or unspecified: Secondary | ICD-10-CM | POA: Diagnosis present

## 2021-08-07 DIAGNOSIS — O09523 Supervision of elderly multigravida, third trimester: Secondary | ICD-10-CM

## 2021-08-07 DIAGNOSIS — O365930 Maternal care for other known or suspected poor fetal growth, third trimester, not applicable or unspecified: Secondary | ICD-10-CM

## 2021-08-17 ENCOUNTER — Encounter: Payer: Medicaid Other | Admitting: Advanced Practice Midwife

## 2021-08-17 ENCOUNTER — Telehealth: Payer: Self-pay

## 2021-08-17 NOTE — Progress Notes (Deleted)
   PRENATAL VISIT NOTE  Subjective:  Jamie Cantrell is a 41 y.o. [phone] at [redacted]w[redacted]d being seen today for ongoing prenatal care.  She is currently monitored for the following issues for this {Blank single:19197::"high-risk","low-risk"} pregnancy and has Supervision of normal pregnancy and Advanced maternal age in multigravida on their problem list.  Patient reports {sx:[id number]}.   .  .   . Denies leaking of fluid.   The following portions of the patient's history were reviewed and updated as appropriate: allergies, current medications, past family history, past medical history, past social history, past surgical history and problem list.   Objective:  There were no vitals filed for this visit.  Fetal Status:           General:  Alert, oriented and cooperative. Patient is in no acute distress.  Skin: Skin is warm and dry. No rash noted.   Cardiovascular: Normal heart rate noted  Respiratory: Normal respiratory effort, no problems with respiration noted  Abdomen: Soft, gravid, appropriate for gestational age.        Pelvic: {Blank single:19197::"Cervical exam performed in the presence of a chaperone","Cervical exam deferred"}        Extremities: Normal range of motion.     Mental Status: Normal mood and affect. Normal behavior. Normal judgment and thought content.   Assessment and Plan:  Pregnancy: [id number] at [redacted]w[redacted]d 1. Encounter for supervision of other normal pregnancy in third trimester ***  2. Unwanted fertility ***  3. [redacted] weeks gestation of pregnancy ***  {Blank single:19197::"Term","Preterm"} labor symptoms and general obstetric precautions including but not limited to vaginal bleeding, contractions, leaking of fluid and fetal movement were reviewed in detail with the patient. Please refer to After Visit Summary for other counseling recommendations.   No follow-ups on file.  Future Appointments  Date Time Provider Department Center  08/17/2021  9:55 AM Leftwich-Kirby, Wilmer Floor,  CNM [facility] None  08/31/2021  9:35 AM Constant, Gigi Gin, MD [facility] None  09/11/2021  9:30 AM [facility] NURSE [facility]  09/11/2021  9:45 AM [facility] US4 [facility]  09/16/2021  9:35 AM Leftwich-Kirby, Wilmer Floor, CNM [facility] None  09/22/2021  9:35 AM Leftwich-Kirby, Wilmer Floor, CNM [facility] None  09/30/2021  9:35 AM Corlis Hove, NP [facility] None    Sharen Counter, CNM

## 2021-08-31 ENCOUNTER — Ambulatory Visit (INDEPENDENT_AMBULATORY_CARE_PROVIDER_SITE_OTHER): Payer: Medicaid Other | Admitting: Obstetrics and Gynecology

## 2021-08-31 ENCOUNTER — Encounter: Payer: Self-pay | Admitting: Obstetrics and Gynecology

## 2021-08-31 VITALS — BP 123/75 | HR 85 | Wt 219.0 lb

## 2021-08-31 DIAGNOSIS — Z3483 Encounter for supervision of other normal pregnancy, third trimester: Secondary | ICD-10-CM

## 2021-08-31 DIAGNOSIS — O09523 Supervision of elderly multigravida, third trimester: Secondary | ICD-10-CM

## 2021-08-31 NOTE — Progress Notes (Signed)
   PRENATAL VISIT NOTE  Subjective:  Jamie Cantrell is a 41 y.o. [phone] at [redacted]w[redacted]d being seen today for ongoing prenatal care.  She is currently monitored for the following issues for this low-risk pregnancy and has Supervision of normal pregnancy and Advanced maternal age in multigravida on their problem list.  Patient reports no complaints.  Contractions: Not present. Vag. Bleeding: None.  Movement: Present. Denies leaking of fluid.   The following portions of the patient's history were reviewed and updated as appropriate: allergies, current medications, past family history, past medical history, past social history, past surgical history and problem list.   Objective:   Vitals:   08/31/21 0930  BP: 123/75  Pulse: 85  Weight: 219 lb (99.3 kg)    Fetal Status: Fetal Heart Rate (bpm): 150 Fundal Height: 35 cm Movement: Present     General:  Alert, oriented and cooperative. Patient is in no acute distress.  Skin: Skin is warm and dry. No rash noted.   Cardiovascular: Normal heart rate noted  Respiratory: Normal respiratory effort, no problems with respiration noted  Abdomen: Soft, gravid, appropriate for gestational age.  Pain/Pressure: Absent     Pelvic: Cervical exam deferred        Extremities: Normal range of motion.     Mental Status: Normal mood and affect. Normal behavior. Normal judgment and thought content.   Assessment and Plan:  Pregnancy: [id number] at [redacted]w[redacted]d 1. Encounter for supervision of other normal pregnancy in third trimester Patient is doing well without complaints Cultures next visit  2. Multigravida of advanced maternal age in third trimester Follow up growth ultrasound 9/1  Preterm labor symptoms and general obstetric precautions including but not limited to vaginal bleeding, contractions, leaking of fluid and fetal movement were reviewed in detail with the patient. Please refer to After Visit Summary for other counseling recommendations.   Return in about  1 week (around 09/07/2021) for in person, ROB, Low risk, cultures.  Future Appointments  Date Time Provider Department Center  08/31/2021  9:55 AM Timoth Schara, Gigi Gin, MD [facility] None  09/11/2021  9:30 AM [facility] NURSE [facility]  09/11/2021  9:45 AM [facility] US4 [facility]  09/16/2021  9:35 AM Leftwich-Kirby, Wilmer Floor, CNM [facility] None  09/22/2021  9:35 AM Leftwich-Kirby, Wilmer Floor, CNM [facility] None  09/30/2021  9:35 AM Corlis Hove, NP [facility] None    Catalina Antigua, MD

## 2021-09-11 ENCOUNTER — Encounter: Payer: Self-pay | Admitting: *Deleted

## 2021-09-11 ENCOUNTER — Ambulatory Visit: Payer: Medicaid Other | Admitting: *Deleted

## 2021-09-11 ENCOUNTER — Ambulatory Visit: Payer: Medicaid Other | Attending: Obstetrics and Gynecology

## 2021-09-11 VITALS — BP 127/76 | HR 73

## 2021-09-11 DIAGNOSIS — Z3A36 36 weeks gestation of pregnancy: Secondary | ICD-10-CM | POA: Diagnosis not present

## 2021-09-11 DIAGNOSIS — O365990 Maternal care for other known or suspected poor fetal growth, unspecified trimester, not applicable or unspecified: Secondary | ICD-10-CM

## 2021-09-11 DIAGNOSIS — O365930 Maternal care for other known or suspected poor fetal growth, third trimester, not applicable or unspecified: Secondary | ICD-10-CM | POA: Insufficient documentation

## 2021-09-11 DIAGNOSIS — O09523 Supervision of elderly multigravida, third trimester: Secondary | ICD-10-CM | POA: Diagnosis not present

## 2021-09-11 DIAGNOSIS — Z3483 Encounter for supervision of other normal pregnancy, third trimester: Secondary | ICD-10-CM | POA: Insufficient documentation

## 2021-09-16 ENCOUNTER — Ambulatory Visit (INDEPENDENT_AMBULATORY_CARE_PROVIDER_SITE_OTHER): Payer: Medicaid Other | Admitting: Advanced Practice Midwife

## 2021-09-16 ENCOUNTER — Encounter: Payer: Self-pay | Admitting: Advanced Practice Midwife

## 2021-09-16 ENCOUNTER — Other Ambulatory Visit (HOSPITAL_COMMUNITY)
Admission: RE | Admit: 2021-09-16 | Discharge: 2021-09-16 | Disposition: A | Discharge status: Home / Self Care | Payer: Medicaid Other | Source: Ambulatory Visit | Attending: Obstetrics & Gynecology | Admitting: Obstetrics & Gynecology

## 2021-09-16 ENCOUNTER — Other Ambulatory Visit: Payer: Self-pay | Admitting: Advanced Practice Midwife

## 2021-09-16 VITALS — BP 142/90 | HR 60 | Wt 220.6 lb

## 2021-09-16 DIAGNOSIS — R030 Elevated blood-pressure reading, without diagnosis of hypertension: Secondary | ICD-10-CM

## 2021-09-16 DIAGNOSIS — Z3483 Encounter for supervision of other normal pregnancy, third trimester: Secondary | ICD-10-CM | POA: Insufficient documentation

## 2021-09-16 DIAGNOSIS — Z3A37 37 weeks gestation of pregnancy: Secondary | ICD-10-CM

## 2021-09-16 NOTE — Progress Notes (Signed)
Pt reports fetal movement, denies pain.  

## 2021-09-16 NOTE — Progress Notes (Signed)
   PRENATAL VISIT NOTE  Subjective:  Jamie Cantrell is a 41 y.o. [phone] at [redacted]w[redacted]d being seen today for ongoing prenatal care.  She is currently monitored for the following issues for this low-risk pregnancy and has Supervision of normal pregnancy and Advanced maternal age in multigravida on their problem list.  Patient reports no complaints.  Contractions: Not present. Vag. Bleeding: None.  Movement: Present. Denies leaking of fluid.   The following portions of the patient's history were reviewed and updated as appropriate: allergies, current medications, past family history, past medical history, past social history, past surgical history and problem list.   Objective:   Vitals:   09/16/21 0931 09/16/21 0935  BP: (!) 140/95 (!) 142/90  Pulse:  60  Weight: 220 lb 9.6 oz (100.1 kg)     Fetal Status: Fetal Heart Rate (bpm): 141 Fundal Height: 37 cm Movement: Present  Presentation: Vertex  General:  Alert, oriented and cooperative. Patient is in no acute distress.  Skin: Skin is warm and dry. No rash noted.   Cardiovascular: Normal heart rate noted  Respiratory: Normal respiratory effort, no problems with respiration noted  Abdomen: Soft, gravid, appropriate for gestational age.  Pain/Pressure: Absent     Pelvic: Cervical exam performed in the presence of a chaperone Dilation: 1 Effacement (%): 0 Station: -3  Extremities: Normal range of motion.  Edema: None  Mental Status: Normal mood and affect. Normal behavior. Normal judgment and thought content.   Assessment and Plan:  Pregnancy: [id number] at [redacted]w[redacted]d  1. Encounter for supervision of other normal pregnancy in third trimester --Anticipatory guidance about next visits/weeks of pregnancy given.   - Culture, Grp B Strep w/Rflx Suscept - Cervicovaginal ancillary only( [place])  2. [redacted] weeks gestation of pregnancy   3. Elevated blood pressure reading without diagnosis of hypertension --Pt reports she had a cup of coffee this  morning and was rushing to get here.  She denies any s/sx of PEC. --Labs today and BP check tomorrow, if normotensive tomorrow, pt to take BP daily on home cuff and enter into Babyscripts --Discussed changes to POC if dx of GHTN, including IOL. Pt states understanding but does not want IOL if it is not medically needed.   - CBC - Comp Met (CMET) - Protein / creatinine ratio, urine   Term labor symptoms and general obstetric precautions including but not limited to vaginal bleeding, contractions, leaking of fluid and fetal movement were reviewed in detail with the patient. Please refer to After Visit Summary for other counseling recommendations.   Return in about 1 day (around 09/17/2021) for Nurse visit BP check tomorrow, 09/17/21, then prenatal visit in 1 week.  Future Appointments  Date Time Provider [place]  09/22/2021  9:35 AM Leftwich-Kirby, Kathie Dike, CNM [facility] None  09/30/2021  9:35 AM Johnston Ebbs, NP [facility] None    Fatima Blank, CNM

## 2021-09-17 ENCOUNTER — Inpatient Hospital Stay (HOSPITAL_COMMUNITY)
Admission: AD | Admit: 2021-09-17 | Discharge: 2021-09-19 | DRG: 807 | Disposition: A | Discharge status: Home / Self Care | Payer: Medicaid Other | Attending: Family Medicine | Admitting: Family Medicine

## 2021-09-17 ENCOUNTER — Encounter (HOSPITAL_COMMUNITY): Payer: Self-pay | Admitting: Family Medicine

## 2021-09-17 ENCOUNTER — Inpatient Hospital Stay (HOSPITAL_COMMUNITY): Payer: Medicaid Other | Admitting: Anesthesiology

## 2021-09-17 ENCOUNTER — Ambulatory Visit: Payer: Medicaid Other | Admitting: General Practice

## 2021-09-17 ENCOUNTER — Other Ambulatory Visit: Payer: Self-pay

## 2021-09-17 DIAGNOSIS — R030 Elevated blood-pressure reading, without diagnosis of hypertension: Secondary | ICD-10-CM

## 2021-09-17 DIAGNOSIS — O134 Gestational [pregnancy-induced] hypertension without significant proteinuria, complicating childbirth: Secondary | ICD-10-CM | POA: Diagnosis present

## 2021-09-17 DIAGNOSIS — Z87891 Personal history of nicotine dependence: Secondary | ICD-10-CM | POA: Diagnosis not present

## 2021-09-17 DIAGNOSIS — O169 Unspecified maternal hypertension, unspecified trimester: Secondary | ICD-10-CM | POA: Diagnosis present

## 2021-09-17 DIAGNOSIS — Z3A37 37 weeks gestation of pregnancy: Secondary | ICD-10-CM

## 2021-09-17 LAB — CBC
HCT: 37.1 % (ref 36.0–46.0)
HCT: 36.4 % (ref 36.0–46.0)
Hemoglobin: 12.4 g/dL (ref 12.0–15.0)
Hemoglobin: 12.3 g/dL (ref 12.0–15.0)
MCH: 28.6 pg (ref 26.0–34.0)
MCH: 29.2 pg (ref 26.0–34.0)
MCHC: 33.4 g/dL (ref 30.0–36.0)
MCHC: 33.8 g/dL (ref 30.0–36.0)
MCV: 85.5 fL (ref 80.0–100.0)
MCV: 86.5 fL (ref 80.0–100.0)
Platelets: 151 10*3/uL (ref 150–400)
Platelets: 139 10*3/uL — ABNORMAL LOW (ref 150–400)
RBC: 4.34 MIL/uL (ref 3.87–5.11)
RBC: 4.21 MIL/uL (ref 3.87–5.11)
RDW: 15.4 % (ref 11.5–15.5)
RDW: 15.6 % — ABNORMAL HIGH (ref 11.5–15.5)
WBC: 9.2 10*3/uL (ref 4.0–10.5)
WBC: 12.2 10*3/uL — ABNORMAL HIGH (ref 4.0–10.5)
nRBC: 0 % (ref 0.0–0.2)
nRBC: 0 % (ref 0.0–0.2)

## 2021-09-17 LAB — TYPE AND SCREEN
ABO/RH(D): O POS
Antibody Screen: NEGATIVE

## 2021-09-17 LAB — PROTEIN / CREATININE RATIO, URINE
Creatinine, Urine: 179 mg/dL
Protein Creatinine Ratio: 0.07 mg/mg{Cre} (ref 0.00–0.15)
Total Protein, Urine: 13 mg/dL

## 2021-09-17 LAB — CERVICOVAGINAL ANCILLARY ONLY
Chlamydia: NEGATIVE
Comment: NEGATIVE
Comment: NORMAL
Neisseria Gonorrhea: NEGATIVE

## 2021-09-17 LAB — GROUP B STREP BY PCR: Group B strep by PCR: NEGATIVE

## 2021-09-17 MED ORDER — MISOPROSTOL 50MCG HALF TABLET: 50.0000 ug | ORAL_TABLET | Freq: Once | ORAL | Status: DC

## 2021-09-17 MED ORDER — LACTATED RINGERS IV SOLN: 500.0000 mL | Freq: Once | INTRAVENOUS | Status: DC

## 2021-09-17 MED ORDER — FENTANYL CITRATE (PF) 100 MCG/2ML IJ SOLN
100.0000 ug | INTRAMUSCULAR | Status: DC | PRN
  Administered 2021-09-17 (×3): 100 ug via INTRAVENOUS
  Filled 2021-09-17 (×2): qty 2

## 2021-09-17 MED ORDER — LACTATED RINGERS IV SOLN: 500.0000 mL | INTRAVENOUS | Status: DC | PRN

## 2021-09-17 MED ORDER — SOD CITRATE-CITRIC ACID 500-334 MG/5ML PO SOLN: 30.0000 mL | ORAL | Status: DC | PRN

## 2021-09-17 MED ORDER — TERBUTALINE SULFATE 1 MG/ML IJ SOLN: 0.2500 mg | Freq: Once | INTRAMUSCULAR | Status: DC | PRN

## 2021-09-17 MED ORDER — EPHEDRINE 5 MG/ML INJ: 10.0000 mg | INTRAVENOUS | Status: DC | PRN

## 2021-09-17 MED ORDER — ACETAMINOPHEN 325 MG PO TABS
650.0000 mg | ORAL_TABLET | ORAL | Status: DC | PRN
  Administered 2021-09-18: 650 mg via ORAL
  Filled 2021-09-17: qty 2

## 2021-09-17 MED ORDER — LIDOCAINE-EPINEPHRINE (PF) 2 %-1:200000 IJ SOLN
INTRAMUSCULAR | Status: DC | PRN
  Administered 2021-09-17: 5 mL via EPIDURAL

## 2021-09-17 MED ORDER — PHENYLEPHRINE 80 MCG/ML (10ML) SYRINGE FOR IV PUSH (FOR BLOOD PRESSURE SUPPORT)
80.0000 ug | PREFILLED_SYRINGE | INTRAVENOUS | Status: DC | PRN
  Filled 2021-09-17: qty 10

## 2021-09-17 MED ORDER — OXYTOCIN-SODIUM CHLORIDE 30-0.9 UT/500ML-% IV SOLN
2.5000 [IU]/h | INTRAVENOUS | Status: DC
  Filled 2021-09-17: qty 500

## 2021-09-17 MED ORDER — OXYTOCIN-SODIUM CHLORIDE 30-0.9 UT/500ML-% IV SOLN
1.0000 m[IU]/min | INTRAVENOUS | Status: DC
  Administered 2021-09-17: 2 m[IU]/min via INTRAVENOUS

## 2021-09-17 MED ORDER — FENTANYL-BUPIVACAINE-NACL 0.5-0.125-0.9 MG/250ML-% EP SOLN: 12.0000 mL/h | EPIDURAL | Status: DC | PRN

## 2021-09-17 MED ORDER — FENTANYL-BUPIVACAINE-NACL 0.5-0.125-0.9 MG/250ML-% EP SOLN
12.0000 mL/h | EPIDURAL | Status: DC | PRN
  Administered 2021-09-17: 12 mL/h via EPIDURAL
  Filled 2021-09-17: qty 250

## 2021-09-17 MED ORDER — LIDOCAINE HCL (PF) 1 % IJ SOLN: 30.0000 mL | INTRAMUSCULAR | Status: DC | PRN

## 2021-09-17 MED ORDER — ONDANSETRON HCL 4 MG/2ML IJ SOLN: 4.0000 mg | Freq: Four times a day (QID) | INTRAMUSCULAR | Status: DC | PRN

## 2021-09-17 MED ORDER — OXYTOCIN BOLUS FROM INFUSION
333.0000 mL | Freq: Once | INTRAVENOUS | Status: AC
  Administered 2021-09-18: 333 mL via INTRAVENOUS

## 2021-09-17 MED ORDER — LACTATED RINGERS IV SOLN: INTRAVENOUS | Status: DC

## 2021-09-17 MED ORDER — PHENYLEPHRINE 80 MCG/ML (10ML) SYRINGE FOR IV PUSH (FOR BLOOD PRESSURE SUPPORT): 80.0000 ug | PREFILLED_SYRINGE | INTRAVENOUS | Status: DC | PRN

## 2021-09-17 MED ORDER — OXYCODONE-ACETAMINOPHEN 5-325 MG PO TABS: 2.0000 | ORAL_TABLET | ORAL | Status: DC | PRN

## 2021-09-17 MED ORDER — FENTANYL CITRATE (PF) 100 MCG/2ML IJ SOLN
INTRAMUSCULAR | Status: AC
  Filled 2021-09-17: qty 2

## 2021-09-17 MED ORDER — DIPHENHYDRAMINE HCL 50 MG/ML IJ SOLN: 12.5000 mg | INTRAMUSCULAR | Status: DC | PRN

## 2021-09-17 MED ORDER — OXYCODONE-ACETAMINOPHEN 5-325 MG PO TABS: 1.0000 | ORAL_TABLET | ORAL | Status: DC | PRN

## 2021-09-17 NOTE — Discharge Summary (Shared)
Postpartum Discharge Summary  Date of Service updated***     Patient Name: Jamie Cantrell DOB: December 28, 1980 MRN: [id number]  Date of admission: 09/17/2021 Delivery date:09/18/2021  Delivering provider: Ilean China R  Date of discharge: 09/18/2021  Admitting diagnosis: Hypertension affecting pregnancy [O16.9] Intrauterine pregnancy: [redacted]w[redacted]d    Secondary diagnosis:  Principal Problem:   Hypertension affecting pregnancy  Additional problems: none    Discharge diagnosis: Term Pregnancy Delivered and Gestational Hypertension                                              Post partum procedures:{Postpartum procedures:23558} Augmentation: AROM, Pitocin, and IP Foley Complications: None  Hospital course: Induction of Labor With Vaginal Delivery   41y.o. yo [id number] admitted to the hospital 09/17/2021 for induction of labor.  Indication for induction: Gestational hypertension.  Patient had an uncomplicated labor course as follows: Membrane Rupture Time/Date: 11:09 PM ,09/17/2021   Delivery Method:Vaginal, Spontaneous  Episiotomy: None  Lacerations:  1st degree  Details of delivery can be found in separate delivery note.  Patient had a routine postpartum course. Patient is discharged home 09/18/21.  Newborn Data: Birth date:09/18/2021  Birth time:4:30 AM  Gender:Female  Living status:Living  Apgars:8 ,9  Weight:   Magnesium Sulfate received: {Mag received:30440022} BMZ received: No Rhophylac:N/A MMR:N/A T-DaP:Given prenatally Flu: N/A Transfusion:{Transfusion received:30440034}  Physical exam  Vitals:   09/18/21 0200 09/18/21 0230 09/18/21 0300 09/18/21 0336  BP: 128/63 (!) 128/59 123/63 (!) 112/49  Pulse: 86 86 80 77  Resp:      Temp:      TempSrc:      SpO2:      Weight:      Height:       General: {Exam; general:21111117} Lochia: {Desc; appropriate/inappropriate:30686::"appropriate"} Uterine Fundus: {Desc; firm/soft:30687} Incision: {Exam;  incision:21111123} DVT Evaluation: {Exam; dvt:2111122} Labs: Lab Results  Component Value Date   WBC 12.2 (H) 09/17/2021   HGB 12.3 09/17/2021   HCT 36.4 09/17/2021   MCV 86.5 09/17/2021   PLT 139 (L) 09/17/2021      Latest Ref Rng & Units 06/01/2017    4:29 PM  CMP  Glucose 65 - 99 mg/dL 99   BUN 6 - 20 mg/dL 10   Creatinine 0.57 - 1.00 mg/dL 0.60   Sodium 134 - 144 mmol/L 140   Potassium 3.5 - 5.2 mmol/L 4.2   Chloride 96 - 106 mmol/L 102   CO2 20 - 29 mmol/L 24   Calcium 8.7 - 10.2 mg/dL 9.4   Total Protein 6.0 - 8.5 g/dL 7.1   Total Bilirubin 0.0 - 1.2 mg/dL 0.3   Alkaline Phos 39 - 117 IU/L 46   AST 0 - 40 IU/L 30   ALT 0 - 32 IU/L 45    Edinburgh Score:     No data to display           After visit meds:  Allergies as of 09/18/2021       Reactions   Penicillins Rash   Has patient had a PCN reaction causing immediate rash, facial/tongue/throat swelling, SOB or lightheadedness with hypotension: Yes Has patient had a PCN reaction causing severe rash involving mucus membranes or skin necrosis: Yes Has patient had a PCN reaction that required hospitalization No Has patient had a PCN reaction occurring within the last 10  years: Yes If all of the above answers are "NO", then may proceed with Cephalosporin use.     Med Rec must be completed prior to using this [facility]***        Discharge home in stable condition Infant Feeding: {Baby feeding:23562} Infant Disposition:{CHL IP OB HOME WITH FQEHAZ:06582} Discharge instruction: per After Visit Summary and Postpartum booklet. Activity: Advance as tolerated. Pelvic rest for 6 weeks.  Diet: {OB GYYO:83584465} Future Appointments: Future Appointments  Date Time Provider [place]  09/22/2021  9:35 AM Leftwich-Kirby, Kathie Dike, CNM [facility] None  09/30/2021  9:35 AM Johnston Ebbs, NP [place] None   Follow up Visit:   Please schedule this patient for a Virtual postpartum visit in 4 weeks with the  following provider: Any provider. Additional Postpartum F/U:BP check 1 week  Low risk pregnancy complicated by: HTN Delivery mode:  Vaginal, Spontaneous  Anticipated Birth Control:  unsure   09/18/2021 Christin Fudge, CNM

## 2021-09-17 NOTE — Anesthesia Procedure Notes (Signed)
Epidural Patient location during procedure: OB Start time: 09/17/2021 10:12 PM End time: 09/17/2021 10:18 PM  Staffing Anesthesiologist: Shelton Silvas, MD Performed: anesthesiologist   Preanesthetic Checklist Completed: patient identified, IV checked, site marked, risks and benefits discussed, surgical consent, monitors and equipment checked, pre-op evaluation and timeout performed  Epidural Patient position: sitting Prep: DuraPrep Patient monitoring: heart rate, continuous pulse ox and blood pressure Approach: midline Location: L3-L4 Injection technique: LOR saline  Needle:  Needle type: Tuohy  Needle gauge: 17 G Needle length: 9 cm Catheter type: closed end flexible Catheter size: 20 Guage Test dose: negative and 1.5% lidocaine  Assessment Events: blood not aspirated, injection not painful, no injection resistance and no paresthesia  Additional Notes LOR @ 5  Patient identified. Risks/Benefits/Options discussed with patient including but not limited to bleeding, infection, nerve damage, paralysis, failed block, incomplete pain control, headache, blood pressure changes, nausea, vomiting, reactions to medications, itching and postpartum back pain. Confirmed with bedside nurse the patient's most recent platelet count. Confirmed with patient that they are not currently taking any anticoagulation, have any bleeding history or any family history of bleeding disorders. Patient expressed understanding and wished to proceed. All questions were answered. Sterile technique was used throughout the entire procedure. Please see nursing notes for vital signs. Test dose was given through epidural catheter and negative prior to continuing to dose epidural or start infusion. Warning signs of high block given to the patient including shortness of breath, tingling/numbness in hands, complete motor block, or any concerning symptoms with instructions to call for help. Patient was given instructions on  fall risk and not to get out of bed. All questions and concerns addressed with instructions to call with any issues or inadequate analgesia.    Reason for block:procedure for pain

## 2021-09-17 NOTE — Progress Notes (Signed)
Subjective:  Jamie Cantrell is a 41 y.o. female presents today for BP check after elevated BP at [facility] visit on 09/16/21.  Hypertension ROS: taking medications as instructed, no medication side effects noted, no TIA's, no chest pain on exertion, no dyspnea on exertion, no swelling of ankles, and palpitations described as "feeling like heart is slipping a beat" .    Objective:  LMP 01/04/2021   Appearance alert, well appearing, and in no distress and oriented to person, place, and time. General exam BP noted to be well controlled today in office.    Assessment:   Blood Pressure needs further observation.   Plan:  Pt sent to MAU per Scheryl Darter, MD as directed admit for induction of labor due to gestational hypertension .

## 2021-09-17 NOTE — Anesthesia Preprocedure Evaluation (Signed)
Anesthesia Evaluation  Patient identified by MRN, date of birth, ID band Patient awake    Reviewed: Allergy & Precautions, Patient's Chart, lab work & pertinent test results  Airway Mallampati: II       Dental   Pulmonary former smoker,    Pulmonary exam normal        Cardiovascular hypertension, Normal cardiovascular exam     Neuro/Psych    GI/Hepatic Neg liver ROS,   Endo/Other    Renal/GU      Musculoskeletal   Abdominal   Peds  Hematology negative hematology ROS (+)   Anesthesia Other Findings   Reproductive/Obstetrics (+) Pregnancy                             Anesthesia Physical Anesthesia Plan  ASA: 2  Anesthesia Plan: Epidural   Post-op Pain Management:    Induction:   PONV Risk Score and Plan:   Airway Management Planned: Natural Airway  Additional Equipment: None  Intra-op Plan:   Post-operative Plan:   Informed Consent: I have reviewed the patients History and Physical, chart, labs and discussed the procedure including the risks, benefits and alternatives for the proposed anesthesia with the patient or authorized representative who has indicated his/her understanding and acceptance.       Plan Discussed with:   Anesthesia Plan Comments: (Lab Results      Component                Value               Date                      WBC                      12.2 (H)            09/17/2021                HGB                      12.3                09/17/2021                HCT                      36.4                09/17/2021                MCV                      86.5                09/17/2021                PLT                      139 (L)             09/17/2021           )        Anesthesia Quick Evaluation

## 2021-09-17 NOTE — Progress Notes (Signed)
Jamie Cantrell is a 41 y.o. [id number] at [redacted]w[redacted]d by ultrasound admitted for induction of labor due to gestational Hypertension.  Subjective: Patient resting well.   Objective: BP 128/75 (BP Location: Left Arm)   Pulse 73   Temp 98 F (36.7 C) (Oral)   Resp 18   Ht 5\' 10"  (1.778 m)   Wt 99.9 kg   LMP 01/04/2021   BMI 31.60 kg/m  No intake/output data recorded. No intake/output data recorded.  FHT:  FHR: 135 bpm, variability: moderate,  accelerations:  Present,  decelerations:  Absent UC:   irregular, 4-7 mins  SVE:   Dilation: 4 Effacement (%): Thick Station: -2 Exam by:: [email or web address] CNM  Labs: Lab Results  Component Value Date   WBC 9.2 09/17/2021   HGB 12.4 09/17/2021   HCT 37.1 09/17/2021   MCV 85.5 09/17/2021   PLT 151 09/17/2021  Patient Vitals for the past 24 hrs:  BP Temp Temp src Pulse Resp Height Weight  09/17/21 1923 128/75 -- -- 73 -- -- --  09/17/21 1813 -- -- -- -- 18 -- --  09/17/21 1810 (!) 140/100 -- -- 88 -- -- --  09/17/21 1720 -- -- -- -- 16 -- --  09/17/21 1644 (!) 124/55 -- -- 71 -- -- --  09/17/21 1552 113/75 -- -- 73 -- -- --  09/17/21 1551 (!) 154/86 -- -- -- -- -- --  09/17/21 1526 -- 98 F (36.7 C) Oral -- -- -- --  09/17/21 1523 (!) 119/92 -- -- 74 -- -- --  09/17/21 1520 -- -- -- -- 16 -- --  09/17/21 1349 (!) 103/55 -- -- 67 16 -- --  09/17/21 1346 (!) 150/131 -- -- 81 -- -- --  09/17/21 1241 (!) 141/72 -- -- 75 16 -- --  09/17/21 1138 130/76 -- -- 67 16 -- --  09/17/21 1135 -- -- -- -- -- 5\' 10"  (1.778 m) 99.9 kg   Results for orders placed or performed during the hospital encounter of 09/17/21 (from the past 24 hour(s))  Type and screen     Status: None   Collection Time: 09/17/21 11:49 AM  Result Value Ref Range   ABO/RH(D) O POS    Antibody Screen NEG    Sample Expiration      09/20/2021,2359 Performed at [facility], [address], [place], [address] [place]   CBC     Status: None   Collection Time: 09/17/21 12:49 PM   Result Value Ref Range   WBC 9.2 4.0 - 10.5 K/uL   RBC 4.34 3.87 - 5.11 MIL/uL   Hemoglobin 12.4 12.0 - 15.0 g/dL   HCT 79024 11/17/21 - 09.7 %   MCV 85.5 80.0 - 100.0 fL   MCH 28.6 26.0 - 34.0 pg   MCHC 33.4 30.0 - 36.0 g/dL   RDW 35.3 29.9 - 24.2 %   Platelets 151 150 - 400 K/uL   nRBC 0.0 0.0 - 0.2 %  Group B strep by PCR     Status: None   Collection Time: 09/17/21  1:04 PM   Specimen: Vaginal/Rectal; Genital  Result Value Ref Range   Group B strep by PCR NEGATIVE NEGATIVE  Protein / creatinine ratio, urine     Status: None   Collection Time: 09/17/21  1:53 PM  Result Value Ref Range   Creatinine, Urine 179 mg/dL   Total Protein, Urine 13 mg/dL   Protein Creatinine Ratio 0.07 0.00 - 0.15 mg/mg[Cre]  1 pressure in severe range during FB placement. Recheck normotensive.   Assessment / Plan: Induction of labor due to gestational hypertension,  progressing well on pitocin s/p FB.   Labor: Progressing on Pitocin, will continue to increase then assess AROM.  Preeclampsia:  labs stable, no PreE at this time.  Fetal Wellbeing:  Category I- continue to monitor for signs of fetal distress.  Pain Control:  Labor support without medications. Patient desires epidural. Patient may have an epidural upon request.  I/D:   GBS PCR Negative. Culture still in process.  Anticipated MOD:  NSVD  Claudette Head, CNM 09/17/2021, 7:46 PM

## 2021-09-17 NOTE — H&P (Signed)
Jamie Cantrell is a 41 y.o. female presenting for IOL for gHTN at [redacted]w[redacted]d. She states no concerns with previous deliveries and denies PreE symptoms.   OB History     Gravida  4   Para  2   Term  2   Preterm      AB  1   Living  2      SAB  1   IAB      Ectopic      Multiple  0   Live Births  2          Past Medical History:  Diagnosis Date   Chlamydia    Kidney stone    Past Surgical History:  Procedure Laterality Date   NO PAST SURGERIES     Family History: family history includes Alzheimer's disease in her maternal grandmother; Heart attack in her maternal grandfather; Hypertension in her father; Ovarian cancer in her paternal grandmother. Social History:  reports that she quit smoking about 5 years ago. Her smoking use included cigarettes. She smoked an average of .25 packs per day. She has never used smokeless tobacco. She reports current alcohol use of about 1.0 standard drink of alcohol per week. She reports that she does not use drugs.     Maternal Diabetes: No Genetic Screening: Normal Maternal Ultrasounds/Referrals: Normal Fetal Ultrasounds or other Referrals:  Was being ,monitored for FGR. Fetal growth normal at 36w scan.  Maternal Substance Abuse:  No  Significant Maternal Medications:  None Significant Maternal Lab Results:  Other: GBS Unknown. Collected, Results Pending.  Number of Prenatal Visits:greater than 3 verified prenatal visits Other Comments:   Last [place] on 09/11/21 Revealed 1 fetus in cephalic presentation. Anterior placenta with normal AFI. EFW 6#3oz in the 34%tile.   Review of Systems  Constitutional:  Negative for chills, fatigue and fever.  Eyes:  Negative for pain and visual disturbance.  Respiratory:  Negative for apnea, shortness of breath and wheezing.   Cardiovascular:  Negative for chest pain and palpitations.  Gastrointestinal:  Negative for abdominal pain, constipation, diarrhea, nausea and vomiting.  Genitourinary:   Negative for difficulty urinating, dysuria, pelvic pain, vaginal bleeding, vaginal discharge and vaginal pain.  Musculoskeletal:  Negative for back pain.  Neurological:  Negative for seizures, weakness and headaches.  Psychiatric/Behavioral:  Negative for suicidal ideas.    Maternal Medical History:  Reason for admission: Nausea.  Prenatal complications: PIH.     Dilation: 1 Effacement (%): 60 Station: -2 Exam by:: Suzie Portela, CNM Blood pressure 130/76, pulse 67, resp. rate 16, height 5\' 10"  (1.778 m), weight 99.9 kg, last menstrual period 01/04/2021, unknown if currently breastfeeding. Maternal Exam:  Uterine Assessment: Contraction frequency is rare.  Abdomen: Fetal presentation: vertex Introitus: Normal vulva.   Physical Exam Vitals and nursing note reviewed.  Constitutional:      General: She is not in acute distress.    Appearance: Normal appearance.  HENT:     Head: Normocephalic.  Pulmonary:     Effort: Pulmonary effort is normal.  Abdominal:     Palpations: Abdomen is soft.  Genitourinary:    General: Normal vulva.  Musculoskeletal:        General: Normal range of motion.     Cervical back: Normal range of motion.  Skin:    General: Skin is warm and dry.  Neurological:     Mental Status: She is alert and oriented to person, place, and time.  Psychiatric:  Mood and Affect: Mood normal.     Prenatal labs: ABO, Rh: --/--/PENDING (09/07 1149) Antibody: PENDING (09/07 1149) Rubella: 5.31 (04/04 1115) RPR: Non Reactive (06/27 1000)  HBsAg: Negative (04/04 1115)  HIV: Non Reactive (06/27 1000)  GBS:   PENDING PCR ordered     Assessment/Plan: - Admit to L&D for IOL at 37 weeks for gHTN.  - Pre E labor collected and are pending  - Foley balloon placed to be started with Low dose pit up to 8 mil/u.  - FHT Cat I- continue to monitor for signs of fetal distress.  - GBS Unknown Results PENDING. No previous hx of GBS infection in pregnancy.  - Plan for SVD.     Claudette Head 09/17/2021, 12:43 PM

## 2021-09-18 ENCOUNTER — Encounter (HOSPITAL_COMMUNITY): Payer: Self-pay | Admitting: Family Medicine

## 2021-09-18 DIAGNOSIS — O134 Gestational [pregnancy-induced] hypertension without significant proteinuria, complicating childbirth: Secondary | ICD-10-CM

## 2021-09-18 DIAGNOSIS — Z3A37 37 weeks gestation of pregnancy: Secondary | ICD-10-CM

## 2021-09-18 LAB — COMPREHENSIVE METABOLIC PANEL
ALT: 9 IU/L (ref 0–32)
AST: 13 IU/L (ref 0–40)
Albumin/Globulin Ratio: 1.4 (ref 1.2–2.2)
Albumin: 3.6 g/dL — ABNORMAL LOW (ref 3.9–4.9)
Alkaline Phosphatase: 92 IU/L (ref 44–121)
BUN/Creatinine Ratio: 8 — ABNORMAL LOW (ref 9–23)
BUN: 5 mg/dL — ABNORMAL LOW (ref 6–20)
Bilirubin Total: 0.3 mg/dL (ref 0.0–1.2)
CO2: 17 mmol/L — ABNORMAL LOW (ref 20–29)
Calcium: 9 mg/dL (ref 8.7–10.2)
Chloride: 102 mmol/L (ref 96–106)
Creatinine, Ser: 0.59 mg/dL (ref 0.57–1.00)
Globulin, Total: 2.6 g/dL (ref 1.5–4.5)
Glucose: 80 mg/dL (ref 70–99)
Potassium: 4.1 mmol/L (ref 3.5–5.2)
Sodium: 136 mmol/L (ref 134–144)
Total Protein: 6.2 g/dL (ref 6.0–8.5)
eGFR: 117 mL/min/{1.73_m2} (ref >59)

## 2021-09-18 LAB — CBC
Hematocrit: 39 % (ref 34.0–46.6)
Hemoglobin: 13 g/dL (ref 11.1–15.9)
MCH: 28.4 pg (ref 26.6–33.0)
MCHC: 33.3 g/dL (ref 31.5–35.7)
MCV: 85 fL (ref 79–97)
Platelets: 141 10*3/uL — ABNORMAL LOW (ref 150–450)
RBC: 4.58 x10E6/uL (ref 3.77–5.28)
RDW: 14.2 % (ref 11.7–15.4)
WBC: 9.9 10*3/uL (ref 3.4–10.8)

## 2021-09-18 LAB — PROTEIN / CREATININE RATIO, URINE
Creatinine, Urine: 21.9 mg/dL
Protein, Ur: 5.3 mg/dL
Protein/Creat Ratio: 242 mg/g creat — ABNORMAL HIGH (ref 0–200)

## 2021-09-18 LAB — RPR: RPR Ser Ql: NONREACTIVE

## 2021-09-18 MED ORDER — LACTATED RINGERS AMNIOINFUSION: INTRAVENOUS | Status: DC

## 2021-09-18 MED ORDER — ONDANSETRON HCL 4 MG PO TABS: 4.0000 mg | ORAL_TABLET | ORAL | Status: DC | PRN

## 2021-09-18 MED ORDER — DOCUSATE SODIUM 100 MG PO CAPS
100.0000 mg | ORAL_CAPSULE | Freq: Two times a day (BID) | ORAL | Status: DC
  Administered 2021-09-19: 100 mg via ORAL
  Filled 2021-09-18: qty 1

## 2021-09-18 MED ORDER — SIMETHICONE 80 MG PO CHEW: 80.0000 mg | CHEWABLE_TABLET | ORAL | Status: DC | PRN

## 2021-09-18 MED ORDER — MEDROXYPROGESTERONE ACETATE 150 MG/ML IM SUSP: 150.0000 mg | INTRAMUSCULAR | Status: DC | PRN

## 2021-09-18 MED ORDER — FERROUS SULFATE 325 (65 FE) MG PO TABS
325.0000 mg | ORAL_TABLET | ORAL | Status: DC
  Administered 2021-09-18: 325 mg via ORAL
  Filled 2021-09-18: qty 1

## 2021-09-18 MED ORDER — MEASLES, MUMPS & RUBELLA VAC IJ SOLR: 0.5000 mL | Freq: Once | INTRAMUSCULAR | Status: DC

## 2021-09-18 MED ORDER — ONDANSETRON HCL 4 MG/2ML IJ SOLN: 4.0000 mg | INTRAMUSCULAR | Status: DC | PRN

## 2021-09-18 MED ORDER — WITCH HAZEL-GLYCERIN EX PADS: 1.0000 | MEDICATED_PAD | CUTANEOUS | Status: DC | PRN

## 2021-09-18 MED ORDER — TETANUS-DIPHTH-ACELL PERTUSSIS 5-2.5-18.5 LF-MCG/0.5 IM SUSY: 0.5000 mL | PREFILLED_SYRINGE | Freq: Once | INTRAMUSCULAR | Status: DC

## 2021-09-18 MED ORDER — BENZOCAINE-MENTHOL 20-0.5 % EX AERO
1.0000 | INHALATION_SPRAY | CUTANEOUS | Status: DC | PRN
  Administered 2021-09-18: 1 via TOPICAL
  Filled 2021-09-18: qty 56

## 2021-09-18 MED ORDER — IBUPROFEN 600 MG PO TABS
600.0000 mg | ORAL_TABLET | Freq: Four times a day (QID) | ORAL | Status: DC
  Administered 2021-09-18 – 2021-09-19 (×6): 600 mg via ORAL
  Filled 2021-09-18 (×6): qty 1

## 2021-09-18 MED ORDER — PRENATAL MULTIVITAMIN CH
1.0000 | ORAL_TABLET | Freq: Every day | ORAL | Status: DC
  Administered 2021-09-18 – 2021-09-19 (×2): 1 via ORAL
  Filled 2021-09-18 (×2): qty 1

## 2021-09-18 MED ORDER — DIPHENHYDRAMINE HCL 25 MG PO CAPS: 25.0000 mg | ORAL_CAPSULE | Freq: Four times a day (QID) | ORAL | Status: DC | PRN

## 2021-09-18 MED ORDER — ACETAMINOPHEN 325 MG PO TABS: 650.0000 mg | ORAL_TABLET | ORAL | Status: DC | PRN

## 2021-09-18 MED ORDER — DIBUCAINE (PERIANAL) 1 % EX OINT: 1.0000 | TOPICAL_OINTMENT | CUTANEOUS | Status: DC | PRN

## 2021-09-18 MED ORDER — COCONUT OIL OIL: 1.0000 | TOPICAL_OIL | Status: DC | PRN

## 2021-09-18 NOTE — Lactation Note (Signed)
This note was copied from a baby's chart. Lactation Consultation Note  Patient Name: Boy Ivah Girardot [id number] Date: 09/18/2021 Reason for consult: L&D Initial assessment;Early term 37-38.6wks Age:40 hours Mom had the baby on the breast when LC came into rm. Mom stated the baby was BF great. Mom stated it's her 3rd baby she thinks she has BF down and has no questions at this time. LC asked mom if she would like to be PRN and call if she needs Lactation or would she like [place] to come back today. Mom stated she would like to be PRN and she will call if needed. Reviewed newborn feeding habits. Congratulated parents.  Maternal Data Does the patient have breastfeeding experience prior to this delivery?: Yes  Feeding    LATCH Score Latch: Grasps breast easily, tongue down, lips flanged, rhythmical sucking.  Audible Swallowing: None  Type of Nipple: Everted at rest and after stimulation  Comfort (Breast/Nipple): Soft / non-tender  Hold (Positioning): No assistance needed to correctly position infant at breast.  LATCH Score: 8   Lactation Tools Discussed/Used    Interventions    Discharge    Consult Status Consult Status: PRN Date: 09/18/21 Follow-up type: In-patient    Charyl Dancer 09/18/2021, 5:33 AM

## 2021-09-18 NOTE — Progress Notes (Signed)
BP 115/63   Pulse 84   Temp 99.1 F (37.3 C) (Oral)   Resp 16   Ht 5\' 10"  (1.778 m)   Wt 99.9 kg   LMP 01/04/2021   SpO2 100%   BMI 31.60 kg/m     SVE: 6/80/-2 FHR: 145bpm, Category II, moderate variability, accelerations present , decelerations: recurrent variables UC : regular 2-52mins  IUPC placed for amnioinfusion Titrate pitocin as indicated for adequate MVU's   1m, Student-MidWife

## 2021-09-19 MED ORDER — DOCUSATE SODIUM 100 MG PO CAPS: 100.0000 mg | ORAL_CAPSULE | Freq: Two times a day (BID) | ORAL | 0 refills | Status: DC

## 2021-09-19 MED ORDER — FUROSEMIDE 20 MG PO TABS: 20.0000 mg | ORAL_TABLET | Freq: Two times a day (BID) | ORAL | 11 refills | Status: DC

## 2021-09-19 MED ORDER — IBUPROFEN 600 MG PO TABS: 600.0000 mg | ORAL_TABLET | Freq: Four times a day (QID) | ORAL | 0 refills | Status: DC

## 2021-09-19 MED ORDER — FERROUS SULFATE 325 (65 FE) MG PO TABS: 325.0000 mg | ORAL_TABLET | ORAL | 0 refills | Status: DC

## 2021-09-19 MED ORDER — BENZOCAINE-MENTHOL 20-0.5 % EX AERO: 1.0000 | INHALATION_SPRAY | CUTANEOUS | 0 refills | Status: DC | PRN

## 2021-09-19 MED ORDER — ACETAMINOPHEN 325 MG PO TABS: 650.0000 mg | ORAL_TABLET | ORAL | 0 refills | Status: DC | PRN

## 2021-09-19 NOTE — Anesthesia Postprocedure Evaluation (Signed)
Anesthesia Post Note  Patient: Jamie Cantrell  Procedure(s) Performed: AN AD HOC LABOR EPIDURAL     Patient location during evaluation: Mother Baby Anesthesia Type: Epidural Level of consciousness: awake and alert Pain management: pain level controlled Vital Signs Assessment: post-procedure vital signs reviewed and stable Respiratory status: spontaneous breathing, nonlabored ventilation and respiratory function stable Cardiovascular status: stable Postop Assessment: no headache, no backache and epidural receding Anesthetic complications: no   No notable events documented.  Last Vitals:  Vitals:   09/18/21 1937 09/19/21 0525  BP: 115/64 114/74  Pulse: 67 69  Resp: 18 18  Temp: 36.6 C (!) 36.1 C  SpO2: 100% 99%    Last Pain:  Vitals:   09/19/21 1324  TempSrc:   PainSc: 5    Pain Goal: Patients Stated Pain Goal: 0 (09/17/21 2200)                 Marrion Coy

## 2021-09-20 LAB — STREP GP B CULTURE+RFLX: Strep Gp B Culture+Rflx: NEGATIVE

## 2021-09-22 ENCOUNTER — Encounter: Payer: Medicaid Other | Admitting: Advanced Practice Midwife

## 2021-09-26 ENCOUNTER — Telehealth (HOSPITAL_COMMUNITY): Payer: Self-pay

## 2021-09-26 NOTE — Telephone Encounter (Signed)
Patient did not answer phone call. Voicemail left for patient.   Sharyn Lull [facility] 09/26/2021,1248

## 2021-09-30 ENCOUNTER — Encounter: Payer: Medicaid Other | Admitting: Student

## 2021-11-04 ENCOUNTER — Ambulatory Visit (INDEPENDENT_AMBULATORY_CARE_PROVIDER_SITE_OTHER): Payer: Medicaid Other

## 2021-11-04 DIAGNOSIS — Z3202 Encounter for pregnancy test, result negative: Secondary | ICD-10-CM

## 2021-11-04 DIAGNOSIS — Z30013 Encounter for initial prescription of injectable contraceptive: Secondary | ICD-10-CM

## 2021-11-04 DIAGNOSIS — Z3200 Encounter for pregnancy test, result unknown: Secondary | ICD-10-CM

## 2021-11-04 LAB — POCT URINE PREGNANCY: Preg Test, Ur: NEGATIVE

## 2021-11-04 MED ORDER — MEDROXYPROGESTERONE ACETATE 150 MG/ML IM SUSP
150.0000 mg | Freq: Once | INTRAMUSCULAR | Status: AC
  Administered 2021-11-04: 150 mg via INTRAMUSCULAR

## 2021-11-04 NOTE — Progress Notes (Unsigned)
Post Partum Visit Note  Jamie Cantrell is a 41 y.o. [phone] female who presents for a postpartum visit. She is 6 weeks postpartum following a normal spontaneous vaginal delivery.  I have fully reviewed the prenatal and intrapartum course. The delivery was at 37 gestational weeks.  Anesthesia: epidural. Postpartum course has been uncomplicated. Baby is doing well. Baby is feeding by breast. Bleeding staining only. Bowel function is normal. Bladder function is normal. Patient is not sexually active. Contraception method is none. Postpartum depression screening: negative.  Patient states breastfeeding is going well and no issues with her breast. Patient states bleeding stopped for about 2 weeks and had some light spotting 2 days ago that resolved.  She denies issues with urination, constipation, or diarrhea. She receives infant care support from the FOB, she endorses having good social support. She endorses safety at home and lives with her SO and 3 children.  She denies feelings of depression. She desires depo provera today and has used in the past with good results. She does desire to have her BTL completed.   The pregnancy intention screening data noted above was reviewed. Potential methods of contraception were discussed. The patient elected to proceed with No data recorded.    Health Maintenance Due  Topic Date Due   COVID-19 Vaccine (3 - Pfizer series) 06/26/2019   INFLUENZA VACCINE  Never done    The following portions of the patient's history were reviewed and updated as appropriate: allergies, current medications, past family history, past medical history, past social history, past surgical history, and problem list.  Review of Systems Pertinent items are noted in HPI.  Objective:  Ht 5\' 10"  (1.778 m)   Wt 211 lb 12.8 oz (96.1 kg)   LMP 01/04/2021   Breastfeeding Yes   BMI 30.39 kg/m    General:  alert, cooperative, and no distress   Breasts:  {desc; normal/abnormal/not  indicated:14647}  Lungs: {lung exam:16931}  Heart:  {heart exam:5510}  Abdomen: {abdomen exam:16834}   Wound {Wound assessment:11097}  GU exam:  {desc; normal/abnormal/not indicated:14647}       Assessment:    There are no diagnoses linked to this encounter.  *** postpartum exam.   Plan:   Essential components of care per ACOG recommendations:  1.  Mood and well being: Patient with negative depression screening today. Reviewed local resources for support.  - Patient tobacco use? No.   - hx of drug use? No.    2. Infant care and feeding:  -Patient currently breastmilk feeding? Yes. Reviewed importance of draining breast regularly to support lactation. Not pumping -Social determinants of health (SDOH) reviewed in EPIC. No concerns  3. Sexuality, contraception and birth spacing - Patient does not want a pregnancy in the next year.  Desired family size is 3 children.  - Reviewed reproductive life planning. Reviewed contraceptive methods based on pt preferences and effectiveness.  Patient desired Hormonal Injection today.   - Discussed birth spacing of 18 months  4. Sleep and fatigue -Encouraged family/partner/community support of 4 hrs of uninterrupted sleep to help with mood and fatigue  5. Physical Recovery  - Discussed patients delivery and complications. She describes her labor as fine. She states "it went well considering it was an induction." - Patient had a Vaginal, no problems at delivery. Patient had a 1st degree laceration. Perineal healing reviewed. Patient expressed understanding - Patient has urinary incontinence? No. - Patient is safe to resume physical and sexual activity  6.  Health Maintenance -  HM due items addressed Yes - Last pap smear  Diagnosis  Date Value Ref Range Status  04/14/2021   Final   - Negative for Intraepithelial Lesions or Malignancy (NILM)  04/14/2021 - Benign reactive/reparative changes  Final   Pap smear not done at today's visit.   -Breast Cancer screening indicated? No.   7. Chronic Disease/Pregnancy Condition follow up: None  - PCP follow up  Lucianne Lei, [place] [facility] [place]

## 2021-12-07 ENCOUNTER — Telehealth (INDEPENDENT_AMBULATORY_CARE_PROVIDER_SITE_OTHER): Payer: Medicaid Other | Admitting: Obstetrics and Gynecology

## 2021-12-07 ENCOUNTER — Encounter: Payer: Self-pay | Admitting: Obstetrics and Gynecology

## 2021-12-07 DIAGNOSIS — Z3009 Encounter for other general counseling and advice on contraception: Secondary | ICD-10-CM

## 2021-12-07 NOTE — Progress Notes (Signed)
    GYNECOLOGY VIRTUAL VISIT ENCOUNTER NOTE  Provider location: [facility] for [facility]   Patient location: Home  I connected with Jamie Cantrell on 12/07/21 at  1:50 PM EST by MyChart Video Encounter and verified that I am speaking with the correct person using two identifiers.   I discussed the limitations, risks, security and privacy concerns of performing an evaluation and management service virtually and the availability of in person appointments. I also discussed with the patient that there may be a patient responsible charge related to this service. The patient expressed understanding and agreed to proceed.   History:  Jamie Cantrell is a 41 y.o. [phone] female being evaluated today for sterilization consultation. She denies any abnormal vaginal discharge, bleeding, pelvic pain or other concerns. Patient is breastfeeding and using depo-provera for contraception currently       Past Medical History:  Diagnosis Date   Chlamydia    Kidney stone    Past Surgical History:  Procedure Laterality Date   NO PAST SURGERIES     The following portions of the patient's history were reviewed and updated as appropriate: allergies, current medications, past family history, past medical history, past social history, past surgical history and problem list.   Health Maintenance:  Normal pap and negative HRHPV on 04/2021.   Review of Systems:  Pertinent items noted in HPI and remainder of comprehensive ROS otherwise negative.  Physical Exam:   General:  Alert, oriented and cooperative. Patient appears to be in no acute distress.  Mental Status: Normal mood and affect. Normal behavior. Normal judgment and thought content.   Respiratory: Normal respiratory effort, no problems with respiration noted  Rest of physical exam deferred due to type of encounter  Labs and Imaging No results found for this or any previous visit (from the past 336 hour(s)). No results found.      Assessment and Plan:     1. Consultation for female sterilization 41 y.o. [phone] with undesired fertility, desires permanent sterilization. Other reversible forms of contraception were discussed with patient; she declines all other modalities.  Risks of procedure discussed with patient including permanence of method, risk of regret, bleeding, infection, injury to surrounding organs and need for additional procedures including laparotomy.  Failure risk less than 0.5% with increased risk of ectopic gestation if pregnancy occurs was also discussed with patient.  Answered all questions and patient verbalized understanding    I provided 15 minutes of face-to-face time during this encounter.   Catalina Antigua, MD Center for [employer], [facility]

## 2022-01-22 ENCOUNTER — Other Ambulatory Visit: Payer: Self-pay | Admitting: Obstetrics and Gynecology

## 2022-01-22 DIAGNOSIS — Z01812 Encounter for preprocedural laboratory examination: Secondary | ICD-10-CM

## 2022-01-25 ENCOUNTER — Encounter (HOSPITAL_COMMUNITY): Payer: Self-pay | Admitting: *Deleted

## 2022-01-25 NOTE — Progress Notes (Signed)
Spoke w/ via phone for pre-op interview--- [place]----   UPT, T&S per surgeon            Lab results------ COVID test -----patient states asymptomatic no test needed Arrive at -------0830 NPO after MN NO Solid Food.  Clear liquids from MN until---0730 Med rec completed Medications to take morning of surgery -----NONE Diabetic medication ----- Patient instructed no nail polish to be worn day of surgery Patient instructed to bring photo id and insurance card day of surgery Patient aware to have Driver (ride ) / caregiver  Elenore Rota Scales Husband  for 24 hours after surgery  Patient Special Instructions ----- Pre-Op special Istructions ----- Patient verbalized understanding of instructions that were given at this phone interview. Patient denies shortness of breath, chest pain, fever, cough at this phone interview.

## 2022-01-27 ENCOUNTER — Encounter (HOSPITAL_BASED_OUTPATIENT_CLINIC_OR_DEPARTMENT_OTHER)
Admission: RE | Disposition: A | Discharge status: Home / Self Care | Payer: Self-pay | Source: Home / Self Care | Attending: Obstetrics and Gynecology

## 2022-01-27 ENCOUNTER — Ambulatory Visit (HOSPITAL_BASED_OUTPATIENT_CLINIC_OR_DEPARTMENT_OTHER): Payer: Medicaid Other | Admitting: Anesthesiology

## 2022-01-27 ENCOUNTER — Encounter (HOSPITAL_BASED_OUTPATIENT_CLINIC_OR_DEPARTMENT_OTHER): Payer: Self-pay | Admitting: Obstetrics and Gynecology

## 2022-01-27 ENCOUNTER — Other Ambulatory Visit: Payer: Self-pay

## 2022-01-27 ENCOUNTER — Ambulatory Visit (HOSPITAL_BASED_OUTPATIENT_CLINIC_OR_DEPARTMENT_OTHER)
Admission: RE | Admit: 2022-01-27 | Discharge: 2022-01-27 | Disposition: A | Discharge status: Home / Self Care | Payer: Medicaid Other | Attending: Obstetrics and Gynecology | Admitting: Obstetrics and Gynecology

## 2022-01-27 DIAGNOSIS — Z302 Encounter for sterilization: Secondary | ICD-10-CM

## 2022-01-27 DIAGNOSIS — I10 Essential (primary) hypertension: Secondary | ICD-10-CM | POA: Insufficient documentation

## 2022-01-27 DIAGNOSIS — Z87891 Personal history of nicotine dependence: Secondary | ICD-10-CM | POA: Diagnosis not present

## 2022-01-27 DIAGNOSIS — Z01812 Encounter for preprocedural laboratory examination: Secondary | ICD-10-CM

## 2022-01-27 HISTORY — PX: LAPAROSCOPIC BILATERAL SALPINGECTOMY: SHX5889

## 2022-01-27 LAB — TYPE AND SCREEN
ABO/RH(D): O POS
Antibody Screen: NEGATIVE

## 2022-01-27 LAB — POCT PREGNANCY, URINE: Preg Test, Ur: NEGATIVE

## 2022-01-27 SURGERY — SALPINGECTOMY, BILATERAL, LAPAROSCOPIC
Anesthesia: General | Site: Abdomen | Laterality: Bilateral

## 2022-01-27 MED ORDER — BUPIVACAINE HCL (PF) 0.25 % IJ SOLN
INTRAMUSCULAR | Status: DC | PRN
  Administered 2022-01-27: 30 mL

## 2022-01-27 MED ORDER — KETOROLAC TROMETHAMINE 30 MG/ML IJ SOLN
INTRAMUSCULAR | Status: DC | PRN
  Administered 2022-01-27: 30 mg via INTRAVENOUS

## 2022-01-27 MED ORDER — MIDAZOLAM HCL 2 MG/2ML IJ SOLN
INTRAMUSCULAR | Status: AC
  Filled 2022-01-27: qty 2

## 2022-01-27 MED ORDER — IBUPROFEN 600 MG PO TABS: 600.0000 mg | ORAL_TABLET | Freq: Four times a day (QID) | ORAL | 3 refills | Status: AC | PRN

## 2022-01-27 MED ORDER — FENTANYL CITRATE (PF) 100 MCG/2ML IJ SOLN
INTRAMUSCULAR | Status: AC
  Filled 2022-01-27: qty 2

## 2022-01-27 MED ORDER — LACTATED RINGERS IV SOLN: INTRAVENOUS | Status: DC

## 2022-01-27 MED ORDER — SODIUM CHLORIDE 0.9 % IR SOLN
Status: DC | PRN
  Administered 2022-01-27: 1000 mL

## 2022-01-27 MED ORDER — FENTANYL CITRATE (PF) 250 MCG/5ML IJ SOLN
INTRAMUSCULAR | Status: DC | PRN
  Administered 2022-01-27: 75 ug via INTRAVENOUS
  Administered 2022-01-27 (×3): 25 ug via INTRAVENOUS
  Administered 2022-01-27: 50 ug via INTRAVENOUS

## 2022-01-27 MED ORDER — POVIDONE-IODINE 10 % EX SWAB: 2.0000 | Freq: Once | CUTANEOUS | Status: DC

## 2022-01-27 MED ORDER — ACETAMINOPHEN 500 MG PO TABS
1000.0000 mg | ORAL_TABLET | Freq: Once | ORAL | Status: AC
  Administered 2022-01-27: 1000 mg via ORAL

## 2022-01-27 MED ORDER — LIDOCAINE 2% (20 MG/ML) 5 ML SYRINGE
INTRAMUSCULAR | Status: DC | PRN
  Administered 2022-01-27: 80 mg via INTRAVENOUS

## 2022-01-27 MED ORDER — PROMETHAZINE HCL 25 MG/ML IJ SOLN: 6.2500 mg | INTRAMUSCULAR | Status: DC | PRN

## 2022-01-27 MED ORDER — ONDANSETRON HCL 4 MG/2ML IJ SOLN
INTRAMUSCULAR | Status: DC | PRN
  Administered 2022-01-27: 4 mg via INTRAVENOUS

## 2022-01-27 MED ORDER — PROPOFOL 10 MG/ML IV BOLUS
INTRAVENOUS | Status: AC
  Filled 2022-01-27: qty 20

## 2022-01-27 MED ORDER — ROCURONIUM BROMIDE 10 MG/ML (PF) SYRINGE
PREFILLED_SYRINGE | INTRAVENOUS | Status: DC | PRN
  Administered 2022-01-27: 60 mg via INTRAVENOUS
  Administered 2022-01-27: 20 mg via INTRAVENOUS

## 2022-01-27 MED ORDER — ACETAMINOPHEN 500 MG PO TABS
ORAL_TABLET | ORAL | Status: AC
  Filled 2022-01-27: qty 2

## 2022-01-27 MED ORDER — FENTANYL CITRATE (PF) 100 MCG/2ML IJ SOLN
25.0000 ug | INTRAMUSCULAR | Status: DC | PRN
  Administered 2022-01-27 (×2): 25 ug via INTRAVENOUS

## 2022-01-27 MED ORDER — MIDAZOLAM HCL 2 MG/2ML IJ SOLN
INTRAMUSCULAR | Status: DC | PRN
  Administered 2022-01-27: 2 mg via INTRAVENOUS

## 2022-01-27 MED ORDER — EPHEDRINE SULFATE (PRESSORS) 50 MG/ML IJ SOLN
INTRAMUSCULAR | Status: DC | PRN
  Administered 2022-01-27: 5 mg via INTRAVENOUS

## 2022-01-27 MED ORDER — DEXAMETHASONE SODIUM PHOSPHATE 10 MG/ML IJ SOLN
INTRAMUSCULAR | Status: DC | PRN
  Administered 2022-01-27: 10 mg via INTRAVENOUS

## 2022-01-27 MED ORDER — PROPOFOL 10 MG/ML IV BOLUS
INTRAVENOUS | Status: DC | PRN
  Administered 2022-01-27: 150 mg via INTRAVENOUS

## 2022-01-27 MED ORDER — OXYCODONE-ACETAMINOPHEN 5-325 MG PO TABS: 1.0000 | ORAL_TABLET | Freq: Four times a day (QID) | ORAL | 0 refills | Status: DC | PRN

## 2022-01-27 MED ORDER — PHENYLEPHRINE 80 MCG/ML (10ML) SYRINGE FOR IV PUSH (FOR BLOOD PRESSURE SUPPORT)
PREFILLED_SYRINGE | INTRAVENOUS | Status: DC | PRN
  Administered 2022-01-27 (×2): 160 ug via INTRAVENOUS
  Administered 2022-01-27: 80 ug via INTRAVENOUS

## 2022-01-27 MED ORDER — SUGAMMADEX SODIUM 200 MG/2ML IV SOLN
INTRAVENOUS | Status: DC | PRN
  Administered 2022-01-27: 200 mg via INTRAVENOUS

## 2022-01-27 SURGICAL SUPPLY — 34 items
ADH SKN CLS APL DERMABOND .7 (GAUZE/BANDAGES/DRESSINGS) ×1
APL SRG 38 LTWT LNG FL B (MISCELLANEOUS)
APPLICATOR ARISTA FLEXITIP XL (MISCELLANEOUS) IMPLANT
COVER MAYO STAND STRL (DRAPES) ×1 IMPLANT
DERMABOND ADVANCED .7 DNX12 (GAUZE/BANDAGES/DRESSINGS) ×1 IMPLANT
DRAPE SURG IRRIG POUCH 19X23 (DRAPES) ×1 IMPLANT
DRSG OPSITE POSTOP 3X4 (GAUZE/BANDAGES/DRESSINGS) IMPLANT
DURAPREP 26ML APPLICATOR (WOUND CARE) ×1 IMPLANT
ELECT REM PT RETURN 9FT ADLT (ELECTROSURGICAL)
ELECTRODE REM PT RTRN 9FT ADLT (ELECTROSURGICAL) IMPLANT
GAUZE 4X4 16PLY ~~LOC~~+RFID DBL (SPONGE) ×1 IMPLANT
GLOVE BIOGEL PI IND STRL 6.5 (GLOVE) ×2 IMPLANT
GLOVE SURG SS PI 6.0 STRL IVOR (GLOVE) ×1 IMPLANT
GLOVE SURG SS PI 6.5 STRL IVOR (GLOVE) ×1 IMPLANT
GOWN STRL REUS W/TWL LRG LVL3 (GOWN DISPOSABLE) ×1 IMPLANT
HEMOSTAT ARISTA ABSORB 3G PWDR (HEMOSTASIS) IMPLANT
KIT PINK PAD W/HEAD ARE REST (MISCELLANEOUS) ×3
KIT PINK PAD W/HEAD ARM REST (MISCELLANEOUS) ×2 IMPLANT
KIT TURNOVER CYSTO (KITS) ×1 IMPLANT
NS IRRIG 1000ML POUR BTL (IV SOLUTION) IMPLANT
PACK LAPAROSCOPY BASIN (CUSTOM PROCEDURE TRAY) ×1 IMPLANT
PROTECTOR NERVE ULNAR (MISCELLANEOUS) ×2 IMPLANT
SET SUCTION IRRIG HYDROSURG (IRRIGATION / IRRIGATOR) IMPLANT
SET TUBE SMOKE EVAC HIGH FLOW (TUBING) ×1 IMPLANT
SHEARS HARMONIC ACE PLUS 36CM (ENDOMECHANICALS) IMPLANT
SUT MNCRL AB 4-0 PS2 18 (SUTURE) ×1 IMPLANT
SUT VICRYL 0 UR6 27IN ABS (SUTURE) IMPLANT
SYS BAG RETRIEVAL 10MM (BASKET)
SYSTEM BAG RETRIEVAL 10MM (BASKET) IMPLANT
TOWEL OR 17X26 10 PK STRL BLUE (TOWEL DISPOSABLE) ×1 IMPLANT
TRAY FOLEY W/BAG SLVR 14FR LF (SET/KITS/TRAYS/PACK) ×1 IMPLANT
TROCAR 5MMX150MM (TROCAR) IMPLANT
TROCAR Z-THREAD FIOS 5X100MM (TROCAR) ×3 IMPLANT
WARMER LAPAROSCOPE (MISCELLANEOUS) ×1 IMPLANT

## 2022-01-27 NOTE — Anesthesia Preprocedure Evaluation (Signed)
Anesthesia Evaluation  Patient identified by MRN, date of birth, ID band Patient awake    Reviewed: Allergy & Precautions, NPO status , Patient's Chart, lab work & pertinent test results  Airway Mallampati: II  TM Distance: >3 FB Neck ROM: Full    Dental  (+) Teeth Intact, Dental Advisory Given   Pulmonary former smoker   Pulmonary exam normal breath sounds clear to auscultation       Cardiovascular hypertension, Normal cardiovascular exam Rhythm:Regular Rate:Normal     Neuro/Psych negative neurological ROS  negative psych ROS   GI/Hepatic negative GI ROS, Neg liver ROS,,,  Endo/Other  negative endocrine ROS    Renal/GU negative Renal ROS     Musculoskeletal negative musculoskeletal ROS (+)    Abdominal   Peds  Hematology negative hematology ROS (+)   Anesthesia Other Findings Day of surgery medications reviewed with the patient.  Reproductive/Obstetrics Undesired fertility                              Anesthesia Physical Anesthesia Plan  ASA: 2  Anesthesia Plan: General   Post-op Pain Management: Tylenol PO (pre-op)* and Toradol IV (intra-op)*   Induction: Intravenous  PONV Risk Score and Plan: 4 or greater and Midazolam, Dexamethasone and Ondansetron  Airway Management Planned: Oral ETT  Additional Equipment:   Intra-op Plan:   Post-operative Plan: Extubation in OR  Informed Consent: I have reviewed the patients History and Physical, chart, labs and discussed the procedure including the risks, benefits and alternatives for the proposed anesthesia with the patient or authorized representative who has indicated his/her understanding and acceptance.     Dental advisory given  Plan Discussed with: CRNA  Anesthesia Plan Comments:        Anesthesia Quick Evaluation

## 2022-01-27 NOTE — Anesthesia Postprocedure Evaluation (Signed)
Anesthesia Post Note  Patient: Jamie Cantrell  Procedure(s) Performed: LAPAROSCOPIC BILATERAL SALPINGECTOMY (Bilateral: Abdomen)     Patient location during evaluation: PACU Anesthesia Type: General Level of consciousness: awake and alert Pain management: pain level controlled Vital Signs Assessment: post-procedure vital signs reviewed and stable Respiratory status: spontaneous breathing, nonlabored ventilation, respiratory function stable and patient connected to nasal cannula oxygen Cardiovascular status: blood pressure returned to baseline and stable Postop Assessment: no apparent nausea or vomiting Anesthetic complications: no   No notable events documented.  Last Vitals:  Vitals:   01/27/22 1115 01/27/22 1140  BP: 114/74 127/78  Pulse: 82 68  Resp: 16 16  Temp: 36.7 C 36.4 C  SpO2: 99% 100%    Last Pain:  Vitals:   01/27/22 1115  TempSrc:   PainSc: [name] Marsela Kuan

## 2022-01-27 NOTE — Discharge Instructions (Signed)
  Post Anesthesia Home Care Instructions  Activity: Get plenty of rest for the remainder of the day. A responsible individual must stay with you for 24 hours following the procedure.  For the next 24 hours, DO NOT: -Drive a car -Paediatric nurse -Drink alcoholic beverages -Take any medication unless instructed by your physician -Make any legal decisions or sign important papers.  Meals: Start with liquid foods such as gelatin or soup. Progress to regular foods as tolerated. Avoid greasy, spicy, heavy foods. If nausea and/or vomiting occur, drink only clear liquids until the nausea and/or vomiting subsides. Call your physician if vomiting continues.  Special Instructions/Symptoms: Your throat may feel dry or sore from the anesthesia or the breathing tube placed in your throat during surgery. If this causes discomfort, gargle with warm salt water. The discomfort should disappear within 24 hours.  If you had a scopolamine patch placed behind your ear for the management of post- operative nausea and/or vomiting:  1. The medication in the patch is effective for 72 hours, after which it should be removed.  Wrap patch in a tissue and discard in the trash. Wash hands thoroughly with soap and water. 2. You may remove the patch earlier than 72 hours if you experience unpleasant side effects which may include dry mouth, dizziness or visual disturbances. 3. Avoid touching the patch. Wash your hands with soap and water after contact with the patch.   No ibuprofen, Advil, Aleve, Motrin, ketorolac, meloxicam, naproxen, or other NSAIDS until after 4:15 pm today if needed. No acetaminophen/Tylenol until after 3 pm today if needed.

## 2022-01-27 NOTE — Anesthesia Procedure Notes (Signed)
Procedure Name: Intubation Date/Time: 01/27/2022 9:35 AM  Performed by: Clearnce Sorrel, CRNAPre-anesthesia Checklist: Patient identified, Emergency Drugs available, Suction available and Patient being monitored Patient Re-evaluated:Patient Re-evaluated prior to induction Oxygen Delivery Method: Circle System Utilized Preoxygenation: Pre-oxygenation with 100% oxygen Induction Type: IV induction Ventilation: Mask ventilation without difficulty Laryngoscope Size: Mac and 3 Grade View: Grade I Tube type: Oral Tube size: 7.0 mm Number of attempts: 1 Airway Equipment and Method: Stylet and Oral airway Placement Confirmation: ETT inserted through vocal cords under direct vision, positive ETCO2 and breath sounds checked- equal and bilateral Secured at: 23 cm Tube secured with: Tape Dental Injury: Teeth and Oropharynx as per pre-operative assessment

## 2022-01-27 NOTE — Op Note (Signed)
Jamie Cantrell PROCEDURE DATE: 01/27/2022   PREOPERATIVE DIAGNOSIS:  Undesired fertility  POSTOPERATIVE DIAGNOSIS:  Undesired fertility  PROCEDURE:  Laparoscopic Bilateral Salpingectomy   SURGEON: Mora Bellman, MD   ANESTHESIA:  General endotracheal  COMPLICATIONS:  None immediate.  ESTIMATED BLOOD LOSS:  20 ml.  FLUIDS: 800 ml LR.  URINE OUTPUT:  100 ml of clear urine.  IINDICATIONS: 42 y.o. [id number] with undesired fertility, desires permanent sterilization. Other reversible forms of contraception were discussed with patient; she declines all other modalities.  Risks of procedure discussed with patient including permanence of method, risk of regret, bleeding, infection, injury to surrounding organs and need for additional procedures including laparotomy.  Failure risk less than 0.5% with increased risk of ectopic gestation if pregnancy occurs was also discussed with patient.  Written informed consent was obtained.    FINDINGS:  Normal uterus, fallopian tubes, and ovaries.  TECHNIQUE:  The patient was taken to the operating room where general anesthesia was obtained without difficulty.  She was then placed in the dorsal lithotomy position and prepared and draped in sterile fashion.  After an adequate timeout was performed, a bivalved speculum was then placed in the patient's vagina, and the anterior lip of cervix grasped with the single-tooth tenaculum.  The uterine manipulator was then advanced into the uterus.  The speculum was removed from the vagina.  Attention was then turned to the patient's abdomen where a 5-mm skin incision was made in the umbilical fold.  The fascia was identified, tented up with Kocher clamps and incised with mayor scissors. The fascia was tagged with 0-Vycril. The 5-mm trocar and sleeve were then advanced without difficulty into the abdomen. Intraabdominal placement was confirmed with the laparoscope. The abdomen was then insufflated with carbon dioxide gas.   Adequate pneumoperitoneum was obtained.  A survey of the patient's pelvis and abdomen revealed the findings above.  Two left lower quadrant  5-mm lower quadrant ports  were then placed under direct visualization.  The fallopian tubes were transected from the uterine attachments and the underlying mesosalpinx with the Harmonic device allowing for bilateral salpingectomy.  The fallopian tubes were then removed from the abdomen under direct visualization.  The operative site was surveyed, and it was found to be hemostatic.   No intraoperative injury to other surrounding organs was noted.  The abdomen was desufflated and all instruments were then removed from the patient's abdomen. The fascial incision of the umbilicus was closed with a 0 Vicryl figure of eight stitch. All skin incisions were closed with Dermabond.  The uterine manipulator was removed from the vagina without complications. The patient tolerated the procedure well.  Sponge, lap, and needle counts were correct times two.  The patient was then taken to the recovery room awake, extubated and in stable condition.  The patient will be discharged to home as per PACU criteria.  Routine postoperative instructions given.

## 2022-01-27 NOTE — H&P (Signed)
Jamie Cantrell is an 42 y.o. female P3 here for scheduled sterilization procedure. Patient has been using depo-provera for contraception since delivery 4 months ago. She is currently breastfeeding and is without complaints. Patient desires to discontinue depo-provera and proceed with scheduled laparoscopic bilateral salpingectiomy  Pertinent Gynecological History: Menses: amenorrhea due to depo-provera Contraception: Depo-Provera injections DES exposure: denies Blood transfusions: none Sexually transmitted diseases: h/o chlamydia infection Last pap: normal Date: 04/2021 OB History: G4, P3013   Menstrual History: No LMP recorded.    Past Medical History:  Diagnosis Date   Chlamydia    Kidney stone     Past Surgical History:  Procedure Laterality Date   DEBRIDEMENT  FOOT     age 12, sedation   NO PAST SURGERIES      Family History  Problem Relation Age of Onset   Hypertension Father    Alzheimer's disease Maternal Grandmother    Heart attack Maternal Grandfather    Ovarian cancer Paternal Grandmother     Social History:  reports that she quit smoking about 5 years ago. Her smoking use included cigarettes. She smoked an average of .25 packs per day. She has never used smokeless tobacco. She reports that she does not currently use alcohol after a past usage of about 1.0 standard drink of alcohol per week. She reports that she does not use drugs.  Allergies:  Allergies  Allergen Reactions   Penicillins Rash    Has patient had a PCN reaction causing immediate rash, facial/tongue/throat swelling, SOB or lightheadedness with hypotension: Yes Has patient had a PCN reaction causing severe rash involving mucus membranes or skin necrosis: Yes Has patient had a PCN reaction that required hospitalization No Has patient had a PCN reaction occurring within the last 10 years: Yes If all of the above answers are "NO", then may proceed with Cephalosporin use.     Medications Prior to  Admission  Medication Sig Dispense Refill Last Dose   ibuprofen (ADVIL) 600 MG tablet Take 1 tablet (600 mg total) by mouth every 6 (six) hours. 30 tablet 0 01/26/2022   loratadine (CLARITIN) 10 MG tablet Take 1 tablet (10 mg total) by mouth daily as needed for allergies. 30 tablet 1 01/26/2022   Prenatal Vit-Fe Sulfate-FA-DHA (PRENATAL VITAMIN/MIN +DHA) 27-0.8-200 MG CAPS Take by mouth.   01/26/2022   acetaminophen (TYLENOL) 325 MG tablet Take 2 tablets (650 mg total) by mouth every 4 (four) hours as needed (for pain scale < 4). 30 tablet 0    benzocaine-Menthol (DERMOPLAST) 20-0.5 % AERO Apply 1 Application topically as needed for irritation (perineal discomfort). (Patient not taking: Reported on 11/04/2021) 78 g 0    Blood Pressure Monitoring (BLOOD PRESSURE KIT) DEVI 1 kit by Does not apply route once a week. (Patient not taking: Reported on 07/23/2021) 1 each 0    docusate sodium (COLACE) 100 MG capsule Take 1 capsule (100 mg total) by mouth 2 (two) times daily. (Patient not taking: Reported on 11/04/2021) 10 capsule 0    ferrous sulfate 325 (65 FE) MG tablet Take 1 tablet (325 mg total) by mouth every other day. (Patient not taking: Reported on 11/04/2021) 30 tablet 0    furosemide (LASIX) 20 MG tablet Take 1 tablet (20 mg total) by mouth 2 (two) times daily for 5 days. (Patient not taking: Reported on 01/27/2022) 30 tablet 11 Not Taking    Review of Systems See pertinent in HPI. All other systems reviewed and non contributory Blood pressure (!) 130/90, pulse  79, temperature 98.2 F (36.8 C), temperature source Oral, resp. rate 16, height 5\' 10"  (1.778 m), weight 98.3 kg, SpO2 98 %, currently breastfeeding. Physical Exam GENERAL: Well-developed, well-nourished female in no acute distress.  LUNGS: Clear to auscultation bilaterally.  HEART: Regular rate and rhythm. ABDOMEN: Soft, nontender, nondistended. No organomegaly. PELVIC: Deferred to OR EXTREMITIES: No cyanosis, clubbing, or edema, 2+  distal pulses.  Results for orders placed or performed during the hospital encounter of 01/27/22 (from the past 24 hour(s))  Pregnancy, urine POC     Status: None   Collection Time: 01/27/22  8:40 AM  Result Value Ref Range   Preg Test, Ur NEGATIVE NEGATIVE    No results found.  Assessment/Plan: 42 y.o. [id number] with undesired fertility, desires permanent sterilization. Other reversible forms of contraception were discussed with patient; she declines all other modalities.  Risks of procedure discussed with patient including permanence of method, risk of regret, bleeding, infection, injury to surrounding organs and need for additional procedures including laparotomy.  Failure risk less than 0.5% with increased risk of ectopic gestation if pregnancy occurs was also discussed with patient.  Written informed consent was obtained.    Jamie Cantrell 01/27/2022, 9:09 AM

## 2022-01-27 NOTE — Transfer of Care (Signed)
Immediate Anesthesia Transfer of Care Note  Patient: Jamie Cantrell  Procedure(s) Performed: LAPAROSCOPIC BILATERAL SALPINGECTOMY (Bilateral: Abdomen)  Patient Location: PACU  Anesthesia Type:General  Level of Consciousness: awake, alert , and oriented  Airway & Oxygen Therapy: Patient Spontanous Breathing  Post-op Assessment: Report given to RN and Post -op Vital signs reviewed and stable  Post vital signs: Reviewed and stable  Last Vitals:  Vitals Value Taken Time  BP 161/100 01/27/22 1038  Temp    Pulse 84 01/27/22 1041  Resp 15 01/27/22 1041  SpO2 96 % 01/27/22 1041  Vitals shown include unvalidated device data.  Last Pain:  Vitals:   01/27/22 0901  TempSrc: Oral  PainSc: 0-No pain      Patients Stated Pain Goal: 4 (11/57/26 2035)  Complications: No notable events documented.

## 2022-01-28 ENCOUNTER — Encounter (HOSPITAL_BASED_OUTPATIENT_CLINIC_OR_DEPARTMENT_OTHER): Payer: Self-pay | Admitting: Obstetrics and Gynecology

## 2022-01-28 LAB — SURGICAL PATHOLOGY

## 2022-02-15 ENCOUNTER — Encounter: Payer: Medicaid Other | Admitting: Obstetrics and Gynecology

## 2022-03-24 ENCOUNTER — Encounter (HOSPITAL_BASED_OUTPATIENT_CLINIC_OR_DEPARTMENT_OTHER): Payer: Self-pay | Admitting: Obstetrics and Gynecology

## 2022-05-03 ENCOUNTER — Encounter: Payer: Self-pay | Admitting: Family Medicine

## 2022-05-03 ENCOUNTER — Ambulatory Visit: Payer: Medicaid Other | Admitting: Family Medicine

## 2022-05-03 VITALS — BP 130/76 | HR 71 | Temp 98.0°F | Ht 69.0 in | Wt 209.0 lb

## 2022-05-03 DIAGNOSIS — R42 Dizziness and giddiness: Secondary | ICD-10-CM

## 2022-05-03 DIAGNOSIS — R12 Heartburn: Secondary | ICD-10-CM

## 2022-05-03 DIAGNOSIS — F4322 Adjustment disorder with anxiety: Secondary | ICD-10-CM

## 2022-05-03 DIAGNOSIS — R202 Paresthesia of skin: Secondary | ICD-10-CM

## 2022-05-03 MED ORDER — FAMOTIDINE 40 MG PO TABS: 40.0000 mg | ORAL_TABLET | Freq: Every day | ORAL | 2 refills | Status: DC

## 2022-05-03 MED ORDER — SERTRALINE HCL 50 MG PO TABS: 50.0000 mg | ORAL_TABLET | Freq: Every day | ORAL | 1 refills | Status: DC

## 2022-05-03 NOTE — Progress Notes (Signed)
New Patient Office Visit  Subjective:  Patient ID: Jamie Cantrell, female    DOB: 1980-06-07  Age: 42 y.o. MRN: [id number]  CC:  Chief Complaint  Patient presents with   Establish Care    Initial visit to establish care with new pcp Having some dizziness, stress and anxiety lately    HPI Jamie Cantrell presents for new patient.  Dizziness  Dizziness-intermitt past several years-not as much during pregnancy(child 7mo). Also, w/strange headache(s) and visual auras/  Not sure if dizziness related or not.  Can be at rest and last several seconds.  More light-head. No palpation. No nausea and vomiting.  Occurs 1-2 times/D about 3-4x/week(s). Drinks plenty of water. Eating well.   Stress and anxiety-getting worse.  At times pressure in chest, harder to cope.  Occasional depression, more anxiety.  No SI.  No treatment(s) in past. Overwhelmed.  Chills end of March for 1 week(s)-left head and leg-intermitt.     Current Outpatient Medications:    famotidine (PEPCID) 40 MG tablet, Take 1 tablet (40 mg total) by mouth daily., Disp: 30 tablet, Rfl: 2   ibuprofen (ADVIL) 600 MG tablet, Take 1 tablet (600 mg total) by mouth every 6 (six) hours as needed., Disp: 60 tablet, Rfl: 3   loratadine (CLARITIN) 10 MG tablet, Take 1 tablet (10 mg total) by mouth daily as needed for allergies., Disp: 30 tablet, Rfl: 1   Prenatal Vit-Fe Sulfate-FA-DHA (PRENATAL VITAMIN/MIN +DHA) 27-0.8-200 MG CAPS, Take by mouth., Disp: , Rfl:    sertraline (ZOLOFT) 50 MG tablet, Take 1 tablet (50 mg total) by mouth daily., Disp: 30 tablet, Rfl: 1  Past Medical History:  Diagnosis Date   Allergy 2008   Penicillin   Chlamydia    Kidney stone     Past Surgical History:  Procedure Laterality Date   DEBRIDEMENT  FOOT     age 47, sedation   LAPAROSCOPIC BILATERAL SALPINGECTOMY Bilateral 01/27/2022   Procedure: LAPAROSCOPIC BILATERAL SALPINGECTOMY;  Surgeon: Catalina Antigua, MD;  Location: [facility];   Service: Gynecology;  Laterality: Bilateral;   TUBAL LIGATION  01/27/2022   Bilateral salpingectomy    Family History  Problem Relation Age of Onset   Neurologic Disorder Mother 63       CJD   Cancer Father 19       bladder   Stroke Father    Hypertension Father    Alcohol abuse Father    Arthritis Father    Alzheimer's disease Maternal Grandmother    Heart attack Maternal Grandfather    Heart disease Maternal Grandfather    Ovarian cancer Paternal Grandmother 51 - 35   Cancer Paternal Grandmother    Arthritis Paternal Grandfather     Social History   Socioeconomic History   Marital status: Single    Spouse name: Jamie Cantrell   Number of children: 3   Years of education: Not on file   Highest education level: Not on file  Occupational History   Not on file  Tobacco Use   Smoking status: Former    Packs/day: 0.50    Years: 10.00    Additional pack years: 0.00    Total pack years: 5.00    Types: Cigarettes    Quit date: 06/01/2016    Years since quitting: 5.9   Smokeless tobacco: Never   Tobacco comments:    5 per day  Vaping Use   Vaping Use: Never used  Substance and Sexual Activity   Alcohol  use: Yes    Alcohol/week: 9.0 standard drinks of alcohol    Types: 6 Cans of beer, 3 Standard drinks or equivalent per week    Comment: 1 or 2 days a week/social-2   Drug use: No   Sexual activity: Yes    Partners: Male    Birth control/protection: Surgical  Other Topics Concern   Not on file  Social History Narrative   unem   Social Determinants of Health   Financial Resource Strain: Not on file  Food Insecurity: Not on file  Transportation Needs: Not on file  Physical Activity: Not on file  Stress: Not on file  Social Connections: Not on file  Intimate Partner Violence: Not on file    ROS  ROS: Gen: no fever, chills  Skin: no rash, itching ENT: seasonal.  Claritin helps Eyes: no blurry vision, double vision-has seen opt.  Resp: no cough,  wheeze,SOB CV: no  palpitations, LE edema,  GI: no, n/v/d/c, abd pain.  + heartburn-Tums occasional-2-3x/week(s).  GU: no dysuria, urgency, frequency, hematuria.  No menses since pregnancy/birth-patient breastfeeding MSK: no joint pain, myalgias, back pain Neuro: HPI Psych:HPI  Objective:   Today's Vitals: BP 130/76   Pulse 71   Temp 98 F (36.7 C) (Temporal)   Ht  (1.753 m)   Wt 209 lb (94.8 kg)   SpO2 99%   Breastfeeding Yes   BMI 30.86 kg/m   Physical Exam  Gen: WDWN NAD [facility] HEENT: NCAT, conjunctiva not injected, sclera nonicteric TM WNL B, OP moist, no exudates  NECK:  supple, no thyromegaly, no nodes, no carotid bruits CARDIAC: RRR, S1S2+, no murmur. DP 2+B LUNGS: CTAB. No wheezes ABDOMEN:  BS+, soft, NTND, No HSM, no masses EXT:  no edema MSK: no gross abnormalities.  NEURO: A&O x3.  CN II-XII intact.  PSYCH: normal mood. Good eye contact   Assessment & Plan:  Dizziness -     CBC with Differential/Platelet; Future -     Comprehensive metabolic panel; Future  Paresthesia -     Vitamin B12; Future -     Comprehensive metabolic panel; Future -     Hemoglobin A1c; Future -     TSH; Future  Heartburn  Adjustment disorder with anxious mood  Other orders -     Famotidine; Take 1 tablet (40 mg total) by mouth daily.  Dispense: 30 tablet; Refill: 2 -     Sertraline HCl; Take 1 tablet (50 mg total) by mouth daily.  Dispense: 30 tablet; Refill: 1  Dizziness-not sure if atypical migraine, other.  Keep log.  Does have headache(s).  Drink plenty of fluids.  Return for labs CBC,CMP Paresthesias-return for labs, CMP,A1C,TSH,B12 Heartburn-chronic.  Pepcid 40 mg daily Adjusment disorder-anxiety.  Newer.  Will do Zoloft 25 mg daily x7d then 50 mg.  SED.   Follow up 1 month(s).   Follow-up: Return in about 4 weeks (around 05/31/2022) for mood-me in 1 mo.  soon labs.   Angelena Sole, MD

## 2022-05-03 NOTE — Patient Instructions (Addendum)
Welcome to [facility]! It was a pleasure meeting you today.  As discussed, Please schedule a 1 month follow up visit today.  Zoloft 1/2 per day for 1 week(s) then whole tab  Keep log of dizziness events  PLEASE NOTE:  If you had any LAB tests please let us know if you have not heard back within a few days. You may see your results on MyChart before we have a chance to review them but we will give you a call once they are reviewed by [place]. If we ordered any REFERRALS today, please let us know if you have not heard from their office within the next week.  Let us know through MyChart if you are needing REFILLS, or have your pharmacy send [place] the request. You can also use MyChart to communicate with me or any office staff.  Please try these tips to maintain a healthy lifestyle:  Eat most of your calories during the day when you are active. Eliminate processed foods including packaged sweets (pies, cakes, cookies), reduce intake of potatoes, white bread, white pasta, and white rice. Look for whole grain options, oat flour or almond flour.  Each meal should contain half fruits/vegetables, one quarter protein, and one quarter carbs (no bigger than a computer mouse).  Cut down on sweet beverages. This includes juice, soda, and sweet tea. Also watch fruit intake, though this is a healthier sweet option, it still contains natural sugar! Limit to 3 servings daily.  Drink at least 1 glass of water with each meal and aim for at least 8 glasses per day  Exercise at least 150 minutes every week.

## 2022-05-05 ENCOUNTER — Other Ambulatory Visit (INDEPENDENT_AMBULATORY_CARE_PROVIDER_SITE_OTHER): Payer: Medicaid Other

## 2022-05-05 DIAGNOSIS — R202 Paresthesia of skin: Secondary | ICD-10-CM | POA: Diagnosis not present

## 2022-05-05 DIAGNOSIS — R42 Dizziness and giddiness: Secondary | ICD-10-CM | POA: Diagnosis not present

## 2022-05-05 LAB — COMPREHENSIVE METABOLIC PANEL
ALT: 32 U/L (ref 0–35)
AST: 25 U/L (ref 0–37)
Albumin: 4.6 g/dL (ref 3.5–5.2)
Alkaline Phosphatase: 62 U/L (ref 39–117)
BUN: 9 mg/dL (ref 6–23)
CO2: 25 mEq/L (ref 19–32)
Calcium: 9.5 mg/dL (ref 8.4–10.5)
Chloride: 102 mEq/L (ref 96–112)
Creatinine, Ser: 0.7 mg/dL (ref 0.40–1.20)
GFR: 108.25 mL/min (ref >60.00)
Glucose, Bld: 91 mg/dL (ref 70–99)
Potassium: 4 mEq/L (ref 3.5–5.1)
Sodium: 139 mEq/L (ref 135–145)
Total Bilirubin: 0.7 mg/dL (ref 0.2–1.2)
Total Protein: 7.3 g/dL (ref 6.0–8.3)

## 2022-05-05 LAB — CBC WITH DIFFERENTIAL/PLATELET
Basophils Absolute: 0 10*3/uL (ref 0.0–0.1)
Basophils Relative: 0.7 % (ref 0.0–3.0)
Eosinophils Absolute: 0.1 10*3/uL (ref 0.0–0.7)
Eosinophils Relative: 1.9 % (ref 0.0–5.0)
HCT: 41.9 % (ref 36.0–46.0)
Hemoglobin: 14.3 g/dL (ref 12.0–15.0)
Lymphocytes Relative: 26.6 % (ref 12.0–46.0)
Lymphs Abs: 1.6 10*3/uL (ref 0.7–4.0)
MCHC: 34.2 g/dL (ref 30.0–36.0)
MCV: 91.1 fl (ref 78.0–100.0)
Monocytes Absolute: 0.4 10*3/uL (ref 0.1–1.0)
Monocytes Relative: 6.9 % (ref 3.0–12.0)
Neutro Abs: 3.8 10*3/uL (ref 1.4–7.7)
Neutrophils Relative %: 63.9 % (ref 43.0–77.0)
Platelets: 186 10*3/uL (ref 150.0–400.0)
RBC: 4.6 Mil/uL (ref 3.87–5.11)
RDW: 13.3 % (ref 11.5–15.5)
WBC: 5.9 10*3/uL (ref 4.0–10.5)

## 2022-05-05 LAB — VITAMIN B12: Vitamin B-12: 242 pg/mL (ref 211–911)

## 2022-05-05 LAB — TSH: TSH: 0.99 u[IU]/mL (ref 0.35–5.50)

## 2022-05-05 LAB — HEMOGLOBIN A1C: Hgb A1c MFr Bld: 4.8 % (ref 4.6–6.5)

## 2022-05-06 NOTE — Progress Notes (Signed)
Labs ok except vitamin B12 is low.  Take OTC (available over the counter without a prescription) daily

## 2022-06-02 ENCOUNTER — Ambulatory Visit (INDEPENDENT_AMBULATORY_CARE_PROVIDER_SITE_OTHER): Payer: Medicaid Other | Admitting: Family Medicine

## 2022-06-02 ENCOUNTER — Encounter: Payer: Self-pay | Admitting: Family Medicine

## 2022-06-02 VITALS — BP 122/76 | HR 70 | Temp 98.3°F | Resp 18 | Ht 69.0 in | Wt 203.4 lb

## 2022-06-02 DIAGNOSIS — R42 Dizziness and giddiness: Secondary | ICD-10-CM

## 2022-06-02 DIAGNOSIS — F4322 Adjustment disorder with anxiety: Secondary | ICD-10-CM

## 2022-06-02 DIAGNOSIS — R12 Heartburn: Secondary | ICD-10-CM | POA: Diagnosis not present

## 2022-06-02 DIAGNOSIS — E538 Deficiency of other specified B group vitamins: Secondary | ICD-10-CM | POA: Diagnosis not present

## 2022-06-02 MED ORDER — FAMOTIDINE 40 MG PO TABS: 40.0000 mg | ORAL_TABLET | Freq: Every day | ORAL | 1 refills | Status: AC

## 2022-06-02 MED ORDER — SERTRALINE HCL 100 MG PO TABS: 100.0000 mg | ORAL_TABLET | Freq: Every day | ORAL | 1 refills | Status: DC

## 2022-06-02 NOTE — Progress Notes (Signed)
Subjective:     Patient ID: Jamie Cantrell, female    DOB: [birth year], 42 y.o.   MRN: [id number]  Chief Complaint  Patient presents with   Follow-up    4 week follow-up      HPI Stress/anxiety-Zoloft 50 mg. At first lightheadedness better.  More calm.  No SI. A little hotter. Dizziness and headache(s)-visual auras.  Once in past 1 month.  Lasted most of day.   GERD-better w/Pepcid.  B12 deficiency-doing much better on supps   There are no preventive care reminders to display for this patient.  Past Medical History:  Diagnosis Date   Allergy 2008   Penicillin   Chlamydia    Kidney stone     Past Surgical History:  Procedure Laterality Date   DEBRIDEMENT  FOOT     age 34, sedation   LAPAROSCOPIC BILATERAL SALPINGECTOMY Bilateral 01/27/2022   Procedure: LAPAROSCOPIC BILATERAL SALPINGECTOMY;  Surgeon: Catalina Antigua, MD;  Location: [facility];  Service: Gynecology;  Laterality: Bilateral;   TUBAL LIGATION  01/27/2022   Bilateral salpingectomy     Current Outpatient Medications:    ibuprofen (ADVIL) 600 MG tablet, Take 1 tablet (600 mg total) by mouth every 6 (six) hours as needed., Disp: 60 tablet, Rfl: 3   loratadine (CLARITIN) 10 MG tablet, Take 1 tablet (10 mg total) by mouth daily as needed for allergies., Disp: 30 tablet, Rfl: 1   Prenatal Vit-Fe Sulfate-FA-DHA (PRENATAL VITAMIN/MIN +DHA) 27-0.8-200 MG CAPS, Take by mouth., Disp: , Rfl:    famotidine (PEPCID) 40 MG tablet, Take 1 tablet (40 mg total) by mouth daily., Disp: 90 tablet, Rfl: 1   sertraline (ZOLOFT) 100 MG tablet, Take 1 tablet (100 mg total) by mouth daily., Disp: 90 tablet, Rfl: 1  Allergies  Allergen Reactions   Penicillins Rash    Has patient had a PCN reaction causing immediate rash, facial/tongue/throat swelling, SOB or lightheadedness with hypotension: Yes Has patient had a PCN reaction causing severe rash involving mucus membranes or skin necrosis: Yes Has patient had a  PCN reaction that required hospitalization No Has patient had a PCN reaction occurring within the last 10 years: Yes If all of the above answers are "NO", then may proceed with Cephalosporin use.    ROS neg/noncontributory except as noted HPI/below      Objective:     BP 122/76   Pulse 70   Temp 98.3 F (36.8 C) (Temporal)   Resp 18   Ht 5\' 9"  (1.753 m)   Wt 203 lb 6 oz (92.3 kg)   SpO2 98%   Breastfeeding Yes   BMI 30.03 kg/m  Wt Readings from Last 3 Encounters:  06/02/22 203 lb 6 oz (92.3 kg)  05/03/22 209 lb (94.8 kg)  01/27/22 216 lb 12.8 oz (98.3 kg)    Physical Exam   Gen: WDWN NAD HEENT: NCAT, conjunctiva not injected, sclera nonicteric CARDIAC: RRR, S1S2+, no murmur.  MSK: no gross abnormalities.  NEURO: A&O x3.  CN II-XII intact.  PSYCH: normal mood. Good eye contact     Assessment & Plan:  Adjustment disorder with anxious mood  Dizziness  Vitamin B 12 deficiency  Heartburn  Other orders -     Famotidine; Take 1 tablet (40 mg total) by mouth daily.  Dispense: 90 tablet; Refill: 1 -     Sertraline HCl; Take 1 tablet (100 mg total) by mouth daily.  Dispense: 90 tablet; Refill: 1   Adjustment disorder-much better but not ideal.  Will increase Zoloft to 100 mg daily Dizziness-?migraine.  ? Stress.  Much better.  Keep log GERD-doing better on Pepcid 40 mg daily.  Continue Vitamin B12 deficiency-feeling better on B12.  continue  Angelena Sole, MD

## 2022-06-02 NOTE — Patient Instructions (Signed)
It was very nice to see you today!  Increase Zoloft to 100mg    PLEASE NOTE:  If you had any lab tests please let us know if you have not heard back within a few days. You may see your results on MyChart before we have a chance to review them but we will give you a call once they are reviewed by [place]. If we ordered any referrals today, please let us know if you have not heard from their office within the next week.   Please try these tips to maintain a healthy lifestyle:  Eat most of your calories during the day when you are active. Eliminate processed foods including packaged sweets (pies, cakes, cookies), reduce intake of potatoes, white bread, white pasta, and white rice. Look for whole grain options, oat flour or almond flour.  Each meal should contain half fruits/vegetables, one quarter protein, and one quarter carbs (no bigger than a computer mouse).  Cut down on sweet beverages. This includes juice, soda, and sweet tea. Also watch fruit intake, though this is a healthier sweet option, it still contains natural sugar! Limit to 3 servings daily.  Drink at least 1 glass of water with each meal and aim for at least 8 glasses per day  Exercise at least 150 minutes every week.

## 2022-08-02 IMAGING — US US MFM OB DETAIL+14 WK
1 series · 13 of 28 positions shown · non-contrast
Comparison: none

[Series 1: us mfm ob detail+14 wk · 13 of 125 slices shown]
[im 5/125]
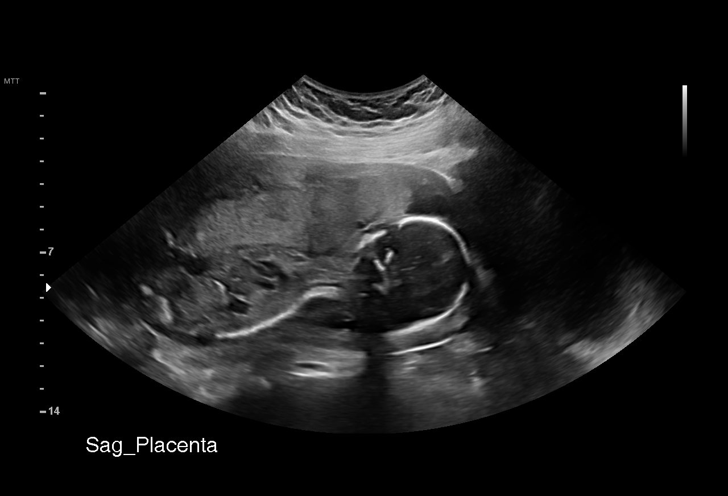
[im 14/125]
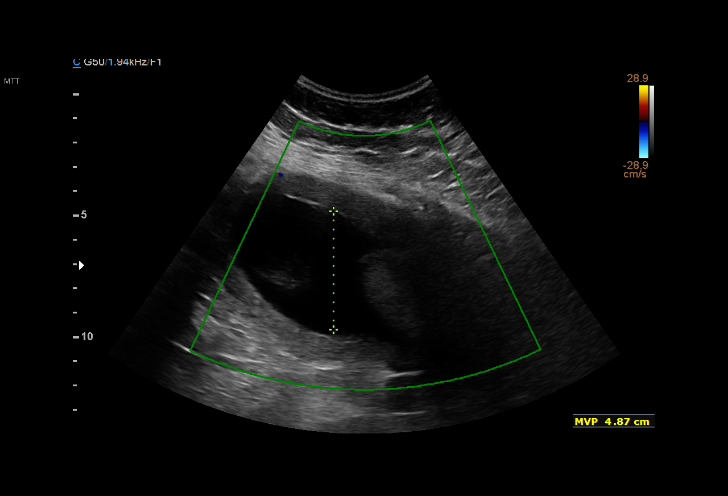
[im 23/125]
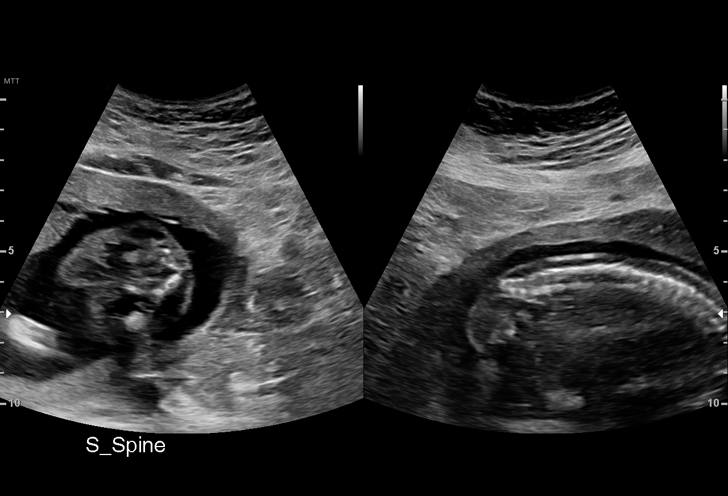
[im 33/125]
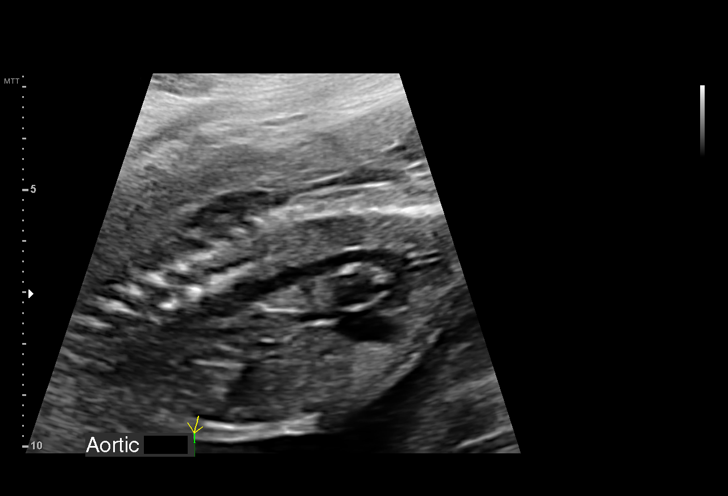
[im 42/125]
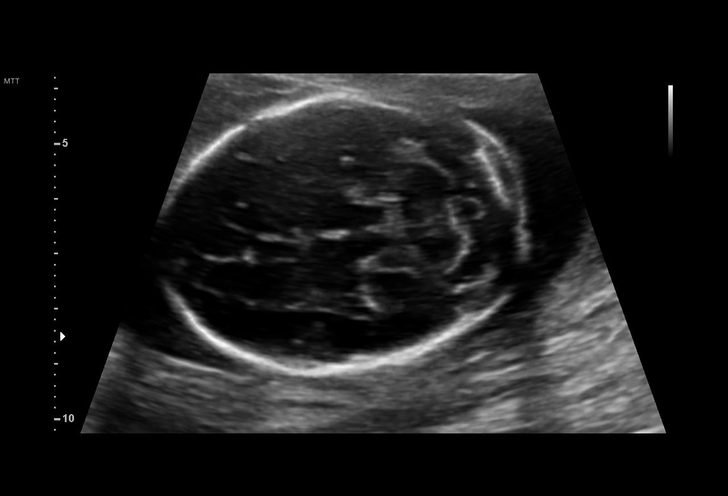
[im 51/125]
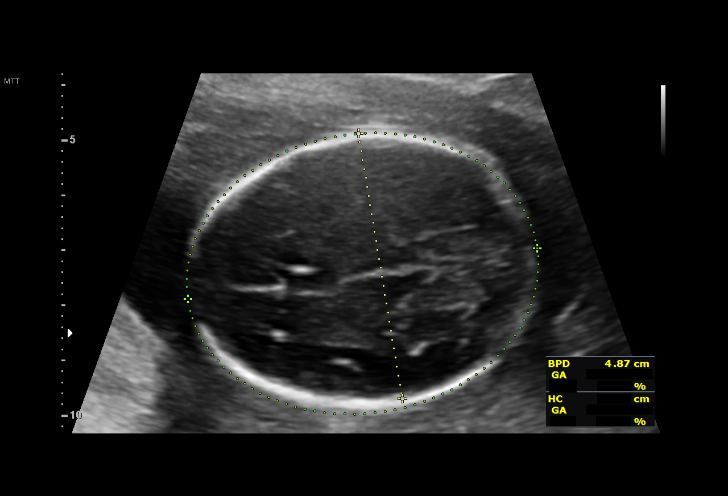
[im 65/125]
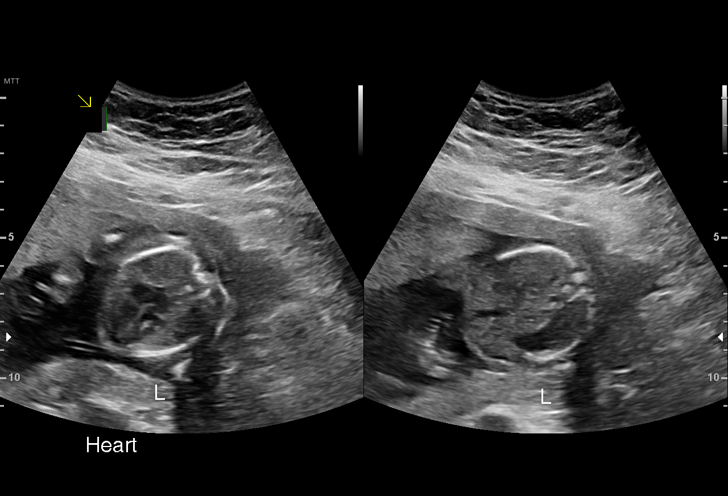
[im 74/125]
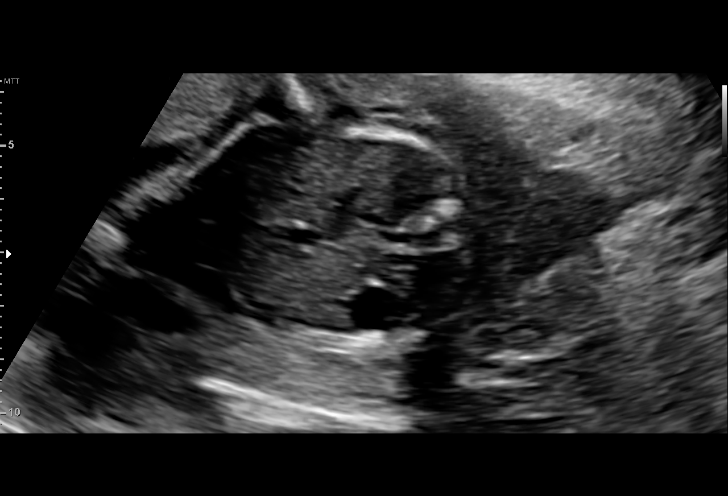
[im 83/125]
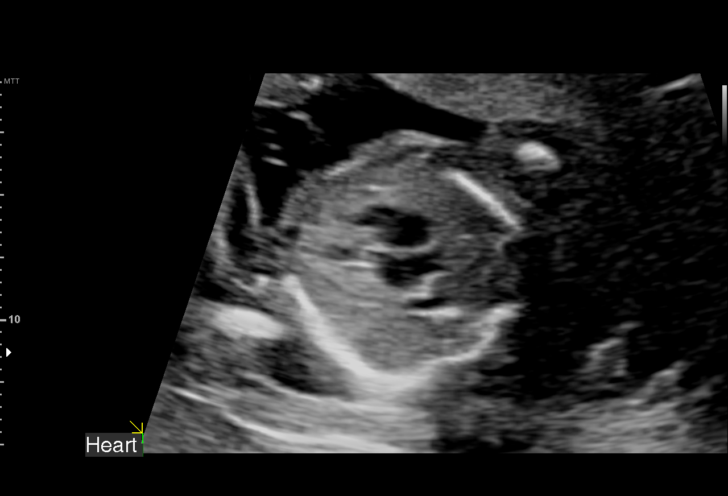
[im 92/125]
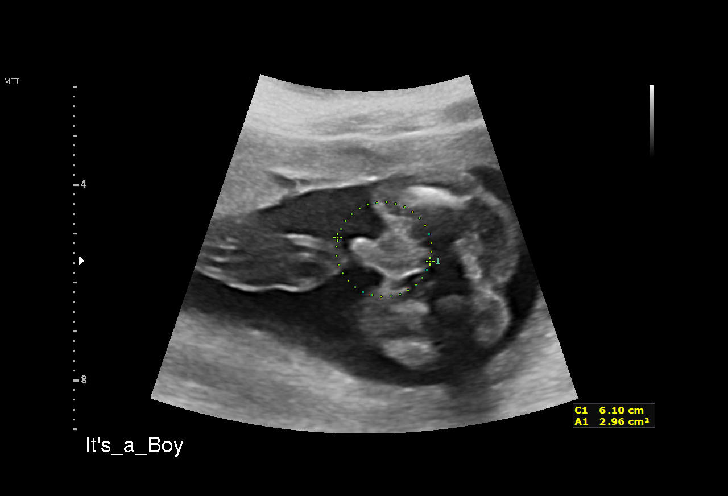
[im 102/125]
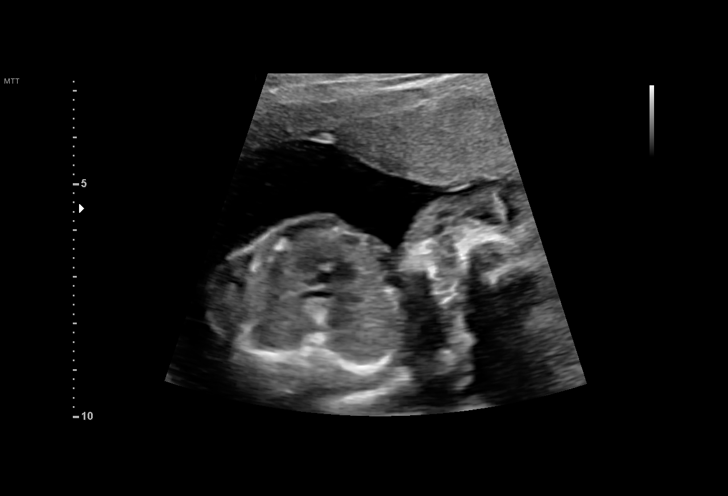
[im 111/125]
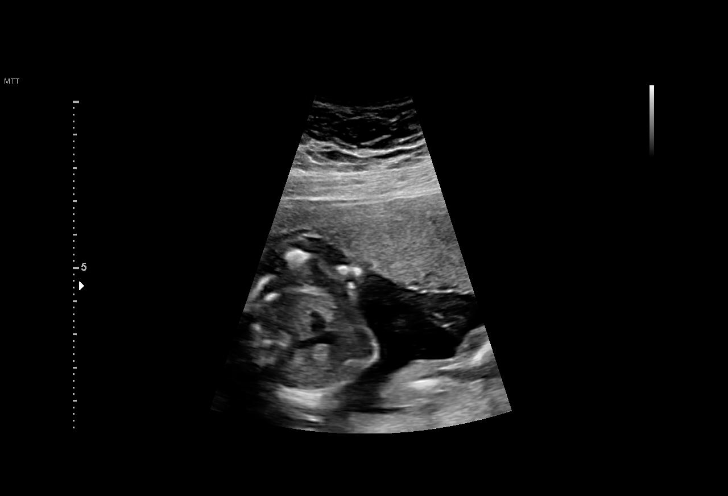
[im 120/125]
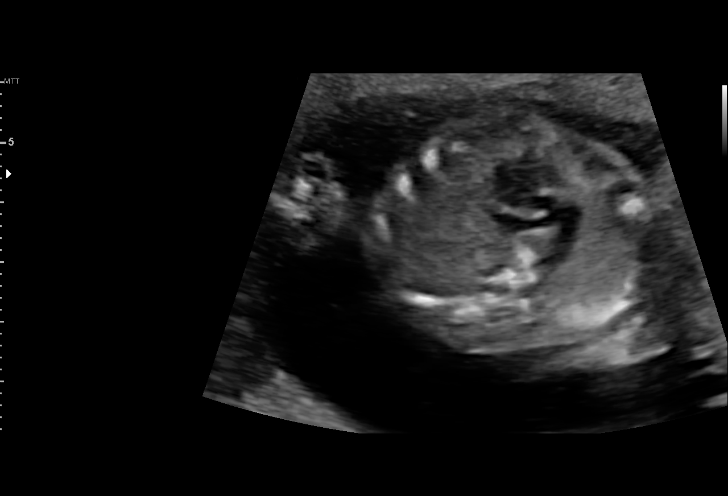

[13 of 28 positions shown; findings below may reference images not displayed]

Indications

 20 weeks gestation of pregnancy
 Encounter for antenatal screening for
 malformations
 Advanced maternal age multigravida 35+,
 second trimester (40 yrs)
 LR NIPS/ Neg Horizon/ Neg AFP
Fetal Evaluation

 Num Of Fetuses:         1
 Fetal Heart Rate(bpm):  148
 Cardiac Activity:       Observed
 Presentation:           Cephalic
 Placenta:               Anterior
 P. Cord Insertion:      Visualized

 Amniotic Fluid
 AFI FV:      Within normal limits

                             Largest Pocket(cm)

Biometry

 BPD:      48.8  mm     G. Age:  20w 5d         69  %    CI:        72.15   %    70 - 86
                                                         FL/HC:      16.7   %    16.8 -
 HC:      182.8  mm     G. Age:  20w 5d         60  %    HC/AC:      1.23        1.09 -
 AC:      149.2  mm     G. Age:  20w 1d         40  %    FL/BPD:     62.5   %
 FL:       30.5  mm     G. Age:  19w 3d         16  %    FL/AC:      20.4   %    20 - 24
 HUM:      30.7  mm     G. Age:  20w 1d         48  %
 CER:        22  mm     G. Age:  20w 5d         83  %
 NFT:       4.7  mm

 LV:        7.1  mm
 CM:        7.3  mm

 Est. FW:     323  gm    0 lb 11 oz      28  %
Gestational Age

 U/S Today:     20w 2d                                        EDD:   10/04/21
 Best:          20w 2d     Det. By:  U/S C R L  (03/18/21)    EDD:   10/04/21
Anatomy

 Cranium:               Appears normal         LVOT:                   Not well visualized
 Cavum:                 Appears normal         Aortic Arch:            Not well visualized
 Ventricles:            Appears normal         Ductal Arch:            Not well visualized
 Choroid Plexus:        Appears normal         Diaphragm:              Appears normal
 Cerebellum:            Appears normal         Stomach:                Appears normal, left
                                                                       sided
 Posterior Fossa:       Appears normal         Abdomen:                Appears normal
 Nuchal Fold:           Appears normal         Abdominal Wall:         Appears nml (cord
                                                                       insert, abd wall)
 Face:                  Orbits nl; profile not Cord Vessels:           Appears normal (3
                        well visualized                                vessel cord)
 Lips:                  Not well visualized    Kidneys:                Appear normal
 Palate:                Not well visualized    Bladder:                Appears normal
 Thoracic:              Appears normal         Spine:                  Appears normal
 Heart:                 Not well visualized;   Upper Extremities:      Visualized
                        EIF
 RVOT:                  Not well visualized    Lower Extremities:      Appears normal

 Other:  Fetus appears to be a male. Heels visualized. Nasal bone visualized.
         Technically difficult due to maternal habitus and fetal position.
Cervix Uterus Adnexa

 Cervix
 Length:           4.66  cm.
 Normal appearance by transabdominal scan.

 Uterus
 No abnormality visualized.

 Right Ovary
 Within normal limits.

 Left Ovary
 Within normal limits.

 Cul De Sac
 No free fluid seen.

 Adnexa
 No abnormality visualized.
Impression

 Single intrauterine pregnancy here for a detailed anatomy
 due to elevated BMI
 Normal anatomy with measurements consistent with dates
 There is good fetal movement and amniotic fluid volume
 Suboptimal views of the fetal anatomy were obtained
 secondary to fetal position.

 An echogenic intracardiac focus was observed today. I
 discussed that there is no increased risk to the function or
 structure of the heart. In addition, in the context of a low risk
 NIPS result the risk for aneuploidy are reduced. There were
 no additional markers of aneuploidy observed.  I reviewed
 that an ultrasound is a screening exam and that a diagnostic
 exam via amnioticenetesis is the only test available to provide
 a definitive result.
 Ms. Paulus N declined further testing at this time
Recommendations

 Follow up growth and anatomy in 4 weeks.

## 2022-12-03 ENCOUNTER — Other Ambulatory Visit: Payer: Self-pay | Admitting: Family Medicine

## 2022-12-03 NOTE — Telephone Encounter (Signed)
Needs appt

## 2022-12-03 NOTE — Telephone Encounter (Signed)
Called pt to inform of need for an OV. VM full, sent mychart message.

## 2023-02-23 ENCOUNTER — Other Ambulatory Visit: Payer: Self-pay | Admitting: Medical Genetics

## 2023-10-25 ENCOUNTER — Other Ambulatory Visit: Payer: Self-pay | Admitting: Medical Genetics

## 2023-10-25 DIAGNOSIS — Z006 Encounter for examination for normal comparison and control in clinical research program: Secondary | ICD-10-CM

## 2023-11-29 LAB — GENECONNECT MOLECULAR SCREEN: Genetic Analysis Overall Interpretation: NEGATIVE
